# Patient Record
Sex: Male | Born: 1950
Health system: Southern US, Community
[De-identification: ages and names within clinical notes are randomized; demographics above are authoritative.]

## PROBLEM LIST (undated history)

## (undated) DIAGNOSIS — I499 Cardiac arrhythmia, unspecified: Secondary | ICD-10-CM

## (undated) HISTORY — PX: EYE SURGERY: SHX253

## (undated) HISTORY — PX: TONSILLECTOMY: SUR1361

## (undated) HISTORY — PX: HERNIA REPAIR: SHX51

## (undated) HISTORY — DX: Cardiac arrhythmia, unspecified: I49.9

---

## 2002-05-22 ENCOUNTER — Ambulatory Visit (HOSPITAL_COMMUNITY): Admission: RE | Admit: 2002-05-22 | Discharge: 2002-05-22 | Payer: Self-pay | Admitting: *Deleted

## 2002-05-22 ENCOUNTER — Encounter (INDEPENDENT_AMBULATORY_CARE_PROVIDER_SITE_OTHER): Payer: Self-pay | Admitting: *Deleted

## 2004-04-08 ENCOUNTER — Ambulatory Visit (HOSPITAL_COMMUNITY): Admission: RE | Admit: 2004-04-08 | Discharge: 2004-04-08 | Payer: Self-pay | Admitting: Internal Medicine

## 2004-06-30 ENCOUNTER — Ambulatory Visit: Payer: Self-pay | Admitting: Internal Medicine

## 2009-05-16 ENCOUNTER — Ambulatory Visit (HOSPITAL_COMMUNITY): Admission: RE | Admit: 2009-05-16 | Discharge: 2009-05-16 | Payer: Self-pay | Admitting: Internal Medicine

## 2015-10-14 DIAGNOSIS — H40053 Ocular hypertension, bilateral: Secondary | ICD-10-CM | POA: Diagnosis not present

## 2015-10-14 DIAGNOSIS — E291 Testicular hypofunction: Secondary | ICD-10-CM | POA: Diagnosis not present

## 2015-10-14 DIAGNOSIS — H524 Presbyopia: Secondary | ICD-10-CM | POA: Diagnosis not present

## 2015-11-28 DIAGNOSIS — N529 Male erectile dysfunction, unspecified: Secondary | ICD-10-CM | POA: Diagnosis not present

## 2015-12-24 DIAGNOSIS — N529 Male erectile dysfunction, unspecified: Secondary | ICD-10-CM | POA: Diagnosis not present

## 2016-01-21 DIAGNOSIS — N529 Male erectile dysfunction, unspecified: Secondary | ICD-10-CM | POA: Diagnosis not present

## 2016-02-17 DIAGNOSIS — N529 Male erectile dysfunction, unspecified: Secondary | ICD-10-CM | POA: Diagnosis not present

## 2016-04-01 DIAGNOSIS — N529 Male erectile dysfunction, unspecified: Secondary | ICD-10-CM | POA: Diagnosis not present

## 2016-04-29 DIAGNOSIS — N529 Male erectile dysfunction, unspecified: Secondary | ICD-10-CM | POA: Diagnosis not present

## 2016-05-26 DIAGNOSIS — N529 Male erectile dysfunction, unspecified: Secondary | ICD-10-CM | POA: Diagnosis not present

## 2016-07-09 DIAGNOSIS — N529 Male erectile dysfunction, unspecified: Secondary | ICD-10-CM | POA: Diagnosis not present

## 2016-08-11 DIAGNOSIS — N529 Male erectile dysfunction, unspecified: Secondary | ICD-10-CM | POA: Diagnosis not present

## 2016-08-24 DIAGNOSIS — E291 Testicular hypofunction: Secondary | ICD-10-CM | POA: Diagnosis not present

## 2016-08-24 DIAGNOSIS — Z125 Encounter for screening for malignant neoplasm of prostate: Secondary | ICD-10-CM | POA: Diagnosis not present

## 2016-08-24 DIAGNOSIS — E785 Hyperlipidemia, unspecified: Secondary | ICD-10-CM | POA: Diagnosis not present

## 2016-08-24 DIAGNOSIS — Z79899 Other long term (current) drug therapy: Secondary | ICD-10-CM | POA: Diagnosis not present

## 2016-08-31 DIAGNOSIS — E290 Testicular hyperfunction: Secondary | ICD-10-CM | POA: Diagnosis not present

## 2016-08-31 DIAGNOSIS — Z0001 Encounter for general adult medical examination with abnormal findings: Secondary | ICD-10-CM | POA: Diagnosis not present

## 2016-08-31 DIAGNOSIS — Z23 Encounter for immunization: Secondary | ICD-10-CM | POA: Diagnosis not present

## 2016-08-31 DIAGNOSIS — R7301 Impaired fasting glucose: Secondary | ICD-10-CM | POA: Diagnosis not present

## 2016-11-11 DIAGNOSIS — N529 Male erectile dysfunction, unspecified: Secondary | ICD-10-CM | POA: Diagnosis not present

## 2016-12-08 ENCOUNTER — Ambulatory Visit (INDEPENDENT_AMBULATORY_CARE_PROVIDER_SITE_OTHER): Payer: Medicare HMO | Admitting: Urology

## 2016-12-08 ENCOUNTER — Ambulatory Visit: Payer: Self-pay | Admitting: Urology

## 2016-12-08 DIAGNOSIS — N486 Induration penis plastica: Secondary | ICD-10-CM | POA: Diagnosis not present

## 2016-12-08 DIAGNOSIS — N401 Enlarged prostate with lower urinary tract symptoms: Secondary | ICD-10-CM

## 2016-12-08 DIAGNOSIS — K4090 Unilateral inguinal hernia, without obstruction or gangrene, not specified as recurrent: Secondary | ICD-10-CM

## 2016-12-08 DIAGNOSIS — E291 Testicular hypofunction: Secondary | ICD-10-CM

## 2016-12-08 DIAGNOSIS — N5201 Erectile dysfunction due to arterial insufficiency: Secondary | ICD-10-CM

## 2016-12-10 DIAGNOSIS — N529 Male erectile dysfunction, unspecified: Secondary | ICD-10-CM | POA: Diagnosis not present

## 2017-02-01 DIAGNOSIS — N529 Male erectile dysfunction, unspecified: Secondary | ICD-10-CM | POA: Diagnosis not present

## 2017-04-30 DIAGNOSIS — N529 Male erectile dysfunction, unspecified: Secondary | ICD-10-CM | POA: Diagnosis not present

## 2017-05-22 DIAGNOSIS — W312XXA Contact with powered woodworking and forming machines, initial encounter: Secondary | ICD-10-CM | POA: Diagnosis not present

## 2017-05-22 DIAGNOSIS — M79642 Pain in left hand: Secondary | ICD-10-CM | POA: Diagnosis not present

## 2017-05-22 DIAGNOSIS — S61217A Laceration without foreign body of left little finger without damage to nail, initial encounter: Secondary | ICD-10-CM | POA: Diagnosis not present

## 2017-05-22 DIAGNOSIS — W268XXA Contact with other sharp object(s), not elsewhere classified, initial encounter: Secondary | ICD-10-CM | POA: Diagnosis not present

## 2017-05-22 DIAGNOSIS — S61317A Laceration without foreign body of left little finger with damage to nail, initial encounter: Secondary | ICD-10-CM | POA: Diagnosis not present

## 2017-06-14 DIAGNOSIS — N529 Male erectile dysfunction, unspecified: Secondary | ICD-10-CM | POA: Diagnosis not present

## 2017-07-23 DIAGNOSIS — N529 Male erectile dysfunction, unspecified: Secondary | ICD-10-CM | POA: Diagnosis not present

## 2017-08-12 DIAGNOSIS — N529 Male erectile dysfunction, unspecified: Secondary | ICD-10-CM | POA: Diagnosis not present

## 2017-09-06 DIAGNOSIS — R945 Abnormal results of liver function studies: Secondary | ICD-10-CM | POA: Diagnosis not present

## 2017-09-06 DIAGNOSIS — R7301 Impaired fasting glucose: Secondary | ICD-10-CM | POA: Diagnosis not present

## 2017-09-06 DIAGNOSIS — Z125 Encounter for screening for malignant neoplasm of prostate: Secondary | ICD-10-CM | POA: Diagnosis not present

## 2017-09-06 DIAGNOSIS — Z79899 Other long term (current) drug therapy: Secondary | ICD-10-CM | POA: Diagnosis not present

## 2017-09-06 DIAGNOSIS — E785 Hyperlipidemia, unspecified: Secondary | ICD-10-CM | POA: Diagnosis not present

## 2017-09-06 DIAGNOSIS — N529 Male erectile dysfunction, unspecified: Secondary | ICD-10-CM | POA: Diagnosis not present

## 2017-09-13 DIAGNOSIS — Z23 Encounter for immunization: Secondary | ICD-10-CM | POA: Diagnosis not present

## 2017-09-13 DIAGNOSIS — Z6828 Body mass index (BMI) 28.0-28.9, adult: Secondary | ICD-10-CM | POA: Diagnosis not present

## 2017-09-13 DIAGNOSIS — Z0001 Encounter for general adult medical examination with abnormal findings: Secondary | ICD-10-CM | POA: Diagnosis not present

## 2017-09-13 DIAGNOSIS — E291 Testicular hypofunction: Secondary | ICD-10-CM | POA: Diagnosis not present

## 2017-09-13 DIAGNOSIS — I491 Atrial premature depolarization: Secondary | ICD-10-CM | POA: Diagnosis not present

## 2017-09-13 DIAGNOSIS — N183 Chronic kidney disease, stage 3 (moderate): Secondary | ICD-10-CM | POA: Diagnosis not present

## 2017-10-04 DIAGNOSIS — N529 Male erectile dysfunction, unspecified: Secondary | ICD-10-CM | POA: Diagnosis not present

## 2017-11-16 DIAGNOSIS — N529 Male erectile dysfunction, unspecified: Secondary | ICD-10-CM | POA: Diagnosis not present

## 2017-11-29 DIAGNOSIS — N529 Male erectile dysfunction, unspecified: Secondary | ICD-10-CM | POA: Diagnosis not present

## 2017-12-14 DIAGNOSIS — N529 Male erectile dysfunction, unspecified: Secondary | ICD-10-CM | POA: Diagnosis not present

## 2018-01-10 DIAGNOSIS — N529 Male erectile dysfunction, unspecified: Secondary | ICD-10-CM | POA: Diagnosis not present

## 2018-02-08 DIAGNOSIS — N529 Male erectile dysfunction, unspecified: Secondary | ICD-10-CM | POA: Diagnosis not present

## 2018-02-24 DIAGNOSIS — N529 Male erectile dysfunction, unspecified: Secondary | ICD-10-CM | POA: Diagnosis not present

## 2018-03-15 DIAGNOSIS — N529 Male erectile dysfunction, unspecified: Secondary | ICD-10-CM | POA: Diagnosis not present

## 2018-03-30 DIAGNOSIS — N529 Male erectile dysfunction, unspecified: Secondary | ICD-10-CM | POA: Diagnosis not present

## 2018-04-21 DIAGNOSIS — N529 Male erectile dysfunction, unspecified: Secondary | ICD-10-CM | POA: Diagnosis not present

## 2018-05-16 DIAGNOSIS — N529 Male erectile dysfunction, unspecified: Secondary | ICD-10-CM | POA: Diagnosis not present

## 2018-06-03 DIAGNOSIS — N529 Male erectile dysfunction, unspecified: Secondary | ICD-10-CM | POA: Diagnosis not present

## 2018-06-21 DIAGNOSIS — N529 Male erectile dysfunction, unspecified: Secondary | ICD-10-CM | POA: Diagnosis not present

## 2018-07-11 DIAGNOSIS — N529 Male erectile dysfunction, unspecified: Secondary | ICD-10-CM | POA: Diagnosis not present

## 2018-07-19 DIAGNOSIS — R69 Illness, unspecified: Secondary | ICD-10-CM | POA: Diagnosis not present

## 2018-08-25 DIAGNOSIS — L723 Sebaceous cyst: Secondary | ICD-10-CM | POA: Diagnosis not present

## 2018-08-25 DIAGNOSIS — N529 Male erectile dysfunction, unspecified: Secondary | ICD-10-CM | POA: Diagnosis not present

## 2018-08-31 DIAGNOSIS — L723 Sebaceous cyst: Secondary | ICD-10-CM | POA: Diagnosis not present

## 2018-09-01 DIAGNOSIS — R69 Illness, unspecified: Secondary | ICD-10-CM | POA: Diagnosis not present

## 2018-09-12 DIAGNOSIS — N529 Male erectile dysfunction, unspecified: Secondary | ICD-10-CM | POA: Diagnosis not present

## 2018-09-19 DIAGNOSIS — R69 Illness, unspecified: Secondary | ICD-10-CM | POA: Diagnosis not present

## 2018-09-27 DIAGNOSIS — Z125 Encounter for screening for malignant neoplasm of prostate: Secondary | ICD-10-CM | POA: Diagnosis not present

## 2018-09-27 DIAGNOSIS — Z79899 Other long term (current) drug therapy: Secondary | ICD-10-CM | POA: Diagnosis not present

## 2018-09-27 DIAGNOSIS — N183 Chronic kidney disease, stage 3 (moderate): Secondary | ICD-10-CM | POA: Diagnosis not present

## 2018-09-27 DIAGNOSIS — E785 Hyperlipidemia, unspecified: Secondary | ICD-10-CM | POA: Diagnosis not present

## 2018-09-27 DIAGNOSIS — N529 Male erectile dysfunction, unspecified: Secondary | ICD-10-CM | POA: Diagnosis not present

## 2018-09-27 DIAGNOSIS — N486 Induration penis plastica: Secondary | ICD-10-CM | POA: Diagnosis not present

## 2018-09-27 DIAGNOSIS — R7301 Impaired fasting glucose: Secondary | ICD-10-CM | POA: Diagnosis not present

## 2018-09-28 DIAGNOSIS — N529 Male erectile dysfunction, unspecified: Secondary | ICD-10-CM | POA: Diagnosis not present

## 2018-10-03 DIAGNOSIS — E291 Testicular hypofunction: Secondary | ICD-10-CM | POA: Diagnosis not present

## 2018-10-03 DIAGNOSIS — N183 Chronic kidney disease, stage 3 (moderate): Secondary | ICD-10-CM | POA: Diagnosis not present

## 2018-10-03 DIAGNOSIS — Z0001 Encounter for general adult medical examination with abnormal findings: Secondary | ICD-10-CM | POA: Diagnosis not present

## 2018-10-03 DIAGNOSIS — N529 Male erectile dysfunction, unspecified: Secondary | ICD-10-CM | POA: Diagnosis not present

## 2018-10-03 DIAGNOSIS — Z6829 Body mass index (BMI) 29.0-29.9, adult: Secondary | ICD-10-CM | POA: Diagnosis not present

## 2018-10-05 ENCOUNTER — Encounter (INDEPENDENT_AMBULATORY_CARE_PROVIDER_SITE_OTHER): Payer: Self-pay | Admitting: *Deleted

## 2018-11-14 DIAGNOSIS — N529 Male erectile dysfunction, unspecified: Secondary | ICD-10-CM | POA: Diagnosis not present

## 2018-11-21 DIAGNOSIS — R69 Illness, unspecified: Secondary | ICD-10-CM | POA: Diagnosis not present

## 2018-12-22 DIAGNOSIS — N529 Male erectile dysfunction, unspecified: Secondary | ICD-10-CM | POA: Diagnosis not present

## 2019-01-18 DIAGNOSIS — N529 Male erectile dysfunction, unspecified: Secondary | ICD-10-CM | POA: Diagnosis not present

## 2019-02-07 DIAGNOSIS — N529 Male erectile dysfunction, unspecified: Secondary | ICD-10-CM | POA: Diagnosis not present

## 2019-03-01 DIAGNOSIS — N529 Male erectile dysfunction, unspecified: Secondary | ICD-10-CM | POA: Diagnosis not present

## 2019-03-02 DIAGNOSIS — R69 Illness, unspecified: Secondary | ICD-10-CM | POA: Diagnosis not present

## 2019-03-30 DIAGNOSIS — N529 Male erectile dysfunction, unspecified: Secondary | ICD-10-CM | POA: Diagnosis not present

## 2019-04-28 DIAGNOSIS — N529 Male erectile dysfunction, unspecified: Secondary | ICD-10-CM | POA: Diagnosis not present

## 2019-05-17 DIAGNOSIS — N529 Male erectile dysfunction, unspecified: Secondary | ICD-10-CM | POA: Diagnosis not present

## 2019-07-03 DIAGNOSIS — E291 Testicular hypofunction: Secondary | ICD-10-CM | POA: Diagnosis not present

## 2019-07-18 ENCOUNTER — Ambulatory Visit: Payer: Medicare HMO | Attending: Internal Medicine

## 2019-07-18 ENCOUNTER — Other Ambulatory Visit: Payer: Self-pay

## 2019-07-18 DIAGNOSIS — Z20822 Contact with and (suspected) exposure to covid-19: Secondary | ICD-10-CM | POA: Diagnosis not present

## 2019-07-20 LAB — NOVEL CORONAVIRUS, NAA: SARS-CoV-2, NAA: DETECTED — AB

## 2019-07-31 DIAGNOSIS — E291 Testicular hypofunction: Secondary | ICD-10-CM | POA: Diagnosis not present

## 2019-10-30 DIAGNOSIS — N183 Chronic kidney disease, stage 3 unspecified: Secondary | ICD-10-CM | POA: Diagnosis not present

## 2019-10-30 DIAGNOSIS — Z125 Encounter for screening for malignant neoplasm of prostate: Secondary | ICD-10-CM | POA: Diagnosis not present

## 2019-10-30 DIAGNOSIS — E291 Testicular hypofunction: Secondary | ICD-10-CM | POA: Diagnosis not present

## 2019-10-30 DIAGNOSIS — N486 Induration penis plastica: Secondary | ICD-10-CM | POA: Diagnosis not present

## 2019-10-30 DIAGNOSIS — R7301 Impaired fasting glucose: Secondary | ICD-10-CM | POA: Diagnosis not present

## 2019-10-30 DIAGNOSIS — N529 Male erectile dysfunction, unspecified: Secondary | ICD-10-CM | POA: Diagnosis not present

## 2019-10-30 DIAGNOSIS — Z79899 Other long term (current) drug therapy: Secondary | ICD-10-CM | POA: Diagnosis not present

## 2019-10-30 DIAGNOSIS — E785 Hyperlipidemia, unspecified: Secondary | ICD-10-CM | POA: Diagnosis not present

## 2019-11-03 DIAGNOSIS — Z0001 Encounter for general adult medical examination with abnormal findings: Secondary | ICD-10-CM | POA: Diagnosis not present

## 2019-11-03 DIAGNOSIS — E291 Testicular hypofunction: Secondary | ICD-10-CM | POA: Diagnosis not present

## 2019-11-03 DIAGNOSIS — N529 Male erectile dysfunction, unspecified: Secondary | ICD-10-CM | POA: Diagnosis not present

## 2019-11-03 DIAGNOSIS — R7301 Impaired fasting glucose: Secondary | ICD-10-CM | POA: Diagnosis not present

## 2019-11-03 DIAGNOSIS — Z6829 Body mass index (BMI) 29.0-29.9, adult: Secondary | ICD-10-CM | POA: Diagnosis not present

## 2019-11-06 ENCOUNTER — Encounter (INDEPENDENT_AMBULATORY_CARE_PROVIDER_SITE_OTHER): Payer: Self-pay | Admitting: *Deleted

## 2019-12-12 DIAGNOSIS — E291 Testicular hypofunction: Secondary | ICD-10-CM | POA: Diagnosis not present

## 2020-01-29 DIAGNOSIS — E291 Testicular hypofunction: Secondary | ICD-10-CM | POA: Diagnosis not present

## 2020-03-05 DIAGNOSIS — R69 Illness, unspecified: Secondary | ICD-10-CM | POA: Diagnosis not present

## 2020-03-08 DIAGNOSIS — R69 Illness, unspecified: Secondary | ICD-10-CM | POA: Diagnosis not present

## 2020-03-20 DIAGNOSIS — E291 Testicular hypofunction: Secondary | ICD-10-CM | POA: Diagnosis not present

## 2020-06-04 DIAGNOSIS — E291 Testicular hypofunction: Secondary | ICD-10-CM | POA: Diagnosis not present

## 2020-06-26 DIAGNOSIS — E291 Testicular hypofunction: Secondary | ICD-10-CM | POA: Diagnosis not present

## 2020-11-11 ENCOUNTER — Encounter (INDEPENDENT_AMBULATORY_CARE_PROVIDER_SITE_OTHER): Payer: Self-pay | Admitting: *Deleted

## 2020-11-29 ENCOUNTER — Other Ambulatory Visit (INDEPENDENT_AMBULATORY_CARE_PROVIDER_SITE_OTHER): Payer: Self-pay

## 2020-11-29 DIAGNOSIS — Z1211 Encounter for screening for malignant neoplasm of colon: Secondary | ICD-10-CM

## 2020-12-13 ENCOUNTER — Encounter (INDEPENDENT_AMBULATORY_CARE_PROVIDER_SITE_OTHER): Payer: Self-pay | Admitting: *Deleted

## 2020-12-13 ENCOUNTER — Telehealth (INDEPENDENT_AMBULATORY_CARE_PROVIDER_SITE_OTHER): Payer: Self-pay | Admitting: *Deleted

## 2020-12-13 MED ORDER — PEG 3350-KCL-NA BICARB-NACL 420 G PO SOLR: 4000.0000 mL | Freq: Once | ORAL | 0 refills | Status: AC

## 2020-12-13 NOTE — Telephone Encounter (Signed)
Done

## 2020-12-13 NOTE — Telephone Encounter (Signed)
Patient needs trilyte 

## 2021-01-03 ENCOUNTER — Telehealth (INDEPENDENT_AMBULATORY_CARE_PROVIDER_SITE_OTHER): Payer: Self-pay | Admitting: *Deleted

## 2021-01-03 NOTE — Telephone Encounter (Signed)
Referring MD/PCP: fagan  Procedure: tcs  Reason/Indication:  screening  Has patient had this procedure before?  Yes, 2006  If so, when, by whom and where?    Is there a family history of colon cancer?  no  Who?  What age when diagnosed?    Is patient diabetic? If yes, Type 1 or Type 2   no      Does patient have prosthetic heart valve or mechanical valve?  no  Do you have a pacemaker/defibrillator?  no  Has patient ever had endocarditis/atrial fibrillation? no  Does patient use oxygen? no  Has patient had joint replacement within last 12 months?  no  Is patient constipated or do they take laxatives? no  Does patient have a history of alcohol/drug use?  no  Have you had a stroke/heart attack last 6 mths? no  Do you take medicine for weight loss?  no  For male patients,: do you still have your menstrual cycle? no  Is patient on blood thinner such as Coumadin, Plavix and/or Aspirin? no  Medications: testosterone shot once a month  Allergies: nkda  Medication Adjustment per Dr Rehman/Dr Jenetta Downer   Procedure date & time: 01/23/21

## 2021-01-16 ENCOUNTER — Telehealth (INDEPENDENT_AMBULATORY_CARE_PROVIDER_SITE_OTHER): Payer: Self-pay

## 2021-01-16 MED ORDER — PEG 3350-KCL-NA BICARB-NACL 420 G PO SOLR: 4000.0000 mL | ORAL | 0 refills | Status: DC

## 2021-01-16 NOTE — Telephone Encounter (Signed)
Corey Suarez, CMA  

## 2021-01-23 ENCOUNTER — Ambulatory Visit (HOSPITAL_COMMUNITY)
Admission: RE | Admit: 2021-01-23 | Discharge: 2021-01-23 | Disposition: A | Discharge status: Home / Self Care | Payer: Medicare Other | Attending: Internal Medicine | Admitting: Internal Medicine

## 2021-01-23 ENCOUNTER — Encounter (HOSPITAL_COMMUNITY)
Admission: RE | Disposition: A | Discharge status: Home / Self Care | Payer: Self-pay | Source: Home / Self Care | Attending: Internal Medicine

## 2021-01-23 ENCOUNTER — Encounter (HOSPITAL_COMMUNITY): Payer: Self-pay | Admitting: Internal Medicine

## 2021-01-23 ENCOUNTER — Other Ambulatory Visit: Payer: Self-pay

## 2021-01-23 DIAGNOSIS — Z79899 Other long term (current) drug therapy: Secondary | ICD-10-CM | POA: Diagnosis not present

## 2021-01-23 DIAGNOSIS — D120 Benign neoplasm of cecum: Secondary | ICD-10-CM | POA: Diagnosis not present

## 2021-01-23 DIAGNOSIS — Z1211 Encounter for screening for malignant neoplasm of colon: Secondary | ICD-10-CM

## 2021-01-23 DIAGNOSIS — K5730 Diverticulosis of large intestine without perforation or abscess without bleeding: Secondary | ICD-10-CM | POA: Diagnosis not present

## 2021-01-23 HISTORY — PX: COLONOSCOPY: SHX5424

## 2021-01-23 HISTORY — PX: POLYPECTOMY: SHX5525

## 2021-01-23 LAB — HM COLONOSCOPY

## 2021-01-23 SURGERY — COLONOSCOPY
Anesthesia: Moderate Sedation

## 2021-01-23 MED ORDER — MEPERIDINE HCL 50 MG/ML IJ SOLN
INTRAMUSCULAR | Status: AC
  Filled 2021-01-23: qty 1

## 2021-01-23 MED ORDER — MIDAZOLAM HCL 5 MG/5ML IJ SOLN
INTRAMUSCULAR | Status: AC
  Filled 2021-01-23: qty 10

## 2021-01-23 MED ORDER — STERILE WATER FOR IRRIGATION IR SOLN
Status: DC | PRN
  Administered 2021-01-23: 1.5 mL

## 2021-01-23 MED ORDER — MIDAZOLAM HCL 5 MG/5ML IJ SOLN
INTRAMUSCULAR | Status: DC | PRN
  Administered 2021-01-23 (×3): 2 mg via INTRAVENOUS

## 2021-01-23 MED ORDER — SODIUM CHLORIDE 0.9 % IV SOLN: INTRAVENOUS | Status: DC

## 2021-01-23 MED ORDER — MEPERIDINE HCL 50 MG/ML IJ SOLN
INTRAMUSCULAR | Status: DC | PRN
  Administered 2021-01-23 (×2): 25 mg via INTRAVENOUS

## 2021-01-23 NOTE — H&P (Signed)
Corey Suarez is an 70 y.o. male.   Chief Complaint: Patient is here for colonoscopy. HPI: Patient is 70 year old Caucasian male who is here for screening colonoscopy.  Last exam was normal in 2006.  He denies abdominal pain change in bowel habits or rectal bleeding.  Family history is negative for CRC.  He does not take aspirin or anticoagulants.  Past medical history  Elevated intraocular pressure Testosterone deficiency  Past Surgical History:  Procedure Laterality Date   EYE SURGERY     HERNIA REPAIR     TONSILLECTOMY      History reviewed. No pertinent family history. Social History:  reports that he has never smoked. He has never used smokeless tobacco. He reports current alcohol use. He reports that he does not use drugs.  Allergies: No Known Allergies  Medications Prior to Admission  Medication Sig Dispense Refill   Cholecalciferol (VITAMIN D3) 125 MCG (5000 UT) TABS Take 5,000 Units by mouth daily.     LUMIGAN 0.01 % SOLN Place 1 drop into both eyes at bedtime.     neomycin-polymyxin b-dexamethasone (MAXITROL) 3.5-10000-0.1 OINT Place 1 application into the left eye 2 (two) times daily.     testosterone cypionate (DEPOTESTOSTERONE CYPIONATE) 200 MG/ML injection Inject 200 mg into the muscle every 28 (twenty-eight) days.     vitamin C (ASCORBIC ACID) 500 MG tablet Take 500 mg by mouth daily.     vitamin E 180 MG (400 UNITS) capsule Take 400 Units by mouth daily.     zinc gluconate 50 MG tablet Take 50 mg by mouth daily.     polyethylene glycol-electrolytes (TRILYTE) 420 g solution Take 4,000 mLs by mouth as directed. 4000 mL 0    No results found for this or any previous visit (from the past 48 hour(s)). No results found.  Review of Systems  Blood pressure (!) 150/104, pulse 78, temperature 97.8 F (36.6 C), temperature source Oral, resp. rate 15, height 5\' 11"  (1.803 m), weight 93 kg, SpO2 95 %. Physical Exam HENT:     Mouth/Throat:     Mouth: Mucous  membranes are moist.     Pharynx: Oropharynx is clear.  Eyes:     General: No scleral icterus.    Conjunctiva/sclera: Conjunctivae normal.  Cardiovascular:     Rate and Rhythm: Normal rate and regular rhythm.     Heart sounds: Normal heart sounds. No murmur heard. Pulmonary:     Effort: Pulmonary effort is normal.     Breath sounds: Normal breath sounds.  Abdominal:     General: There is no distension.     Palpations: Abdomen is soft.     Tenderness: There is no abdominal tenderness.  Musculoskeletal:        General: No swelling.     Cervical back: Neck supple.  Lymphadenopathy:     Cervical: No cervical adenopathy.  Skin:    General: Skin is warm and dry.  Neurological:     Mental Status: He is alert.     Assessment/Plan  Average risk screening colonoscopy  Hildred Laser, MD 01/23/2021, 9:49 AM

## 2021-01-23 NOTE — Discharge Instructions (Signed)
No aspirin or NSAIDs for 24 hours.   °Resume usual medications as before. °High-fiber diet. °No driving for 24 hours. °Physician will call with biopsy results. °

## 2021-01-23 NOTE — Op Note (Signed)
[facility] Patient Name: Corey Suarez Procedure Date: 01/23/2021 9:30 AM MRN: [id number] Date of Birth: 16-Jun-1951 Attending MD: Hildred Laser , MD CSN: [id number] Age: 70 Admit Type: Outpatient Procedure:                Colonoscopy Indications:              Screening for colorectal malignant neoplasm Providers:                Hildred Laser, MD, Caprice Kluver, Raphael Gibney,                            Technician Referring MD:             Asencion Noble, MD Medicines:                Meperidine 50 mg IV, Midazolam 6 mg IV Complications:            No immediate complications. Estimated Blood Loss:     Estimated blood loss was minimal. Procedure:                Pre-Anesthesia Assessment:                           - Prior to the procedure, a History and Physical                            was performed, and patient medications and                            allergies were reviewed. The patient's tolerance of                            previous anesthesia was also reviewed. The risks                            and benefits of the procedure and the sedation                            options and risks were discussed with the patient.                            All questions were answered, and informed consent                            was obtained. Prior Anticoagulants: The patient has                            taken no previous anticoagulant or antiplatelet                            agents. ASA Grade Assessment: I - A normal, healthy                            patient. After reviewing the risks and benefits,  the patient was deemed in satisfactory condition to                            undergo the procedure.                           After obtaining informed consent, the colonoscope                            was passed under direct vision. Throughout the                            procedure, the patient's blood pressure, pulse, and                             oxygen saturations were monitored continuously. The                            [id number]-[id number] ([id number]) scope was introduced through                            the anus and advanced to the the cecum, identified                            by appendiceal orifice and ileocecal valve. The                            colonoscopy was performed without difficulty. The                            patient tolerated the procedure well. The quality                            of the bowel preparation was excellent. The                            ileocecal valve, appendiceal orifice, and rectum                            were photographed. Scope In: 10:00:12 AM Scope Out: 10:15:34 AM Scope Withdrawal Time: 0 hours 9 minutes 16 seconds  Total Procedure Duration: 0 hours 15 minutes 22 seconds  Findings:      The perianal and digital rectal examinations were normal.      A diminutive polyp was found in the cecum. Biopsies were taken with a       cold forceps for histology.      Multiple small and large-mouthed diverticula were found in the sigmoid       colon.      The retroflexed view of the distal rectum and anal verge was normal and       showed no anal or rectal abnormalities. Impression:               - One diminutive polyp in the cecum. Biopsied.                           -  Diverticulosis in the sigmoid colon. Moderate Sedation:      Moderate (conscious) sedation was administered by the endoscopy nurse       and supervised by the endoscopist. The following parameters were       monitored: oxygen saturation, heart rate, blood pressure, CO2       capnography and response to care. Total physician intraservice time was       21 minutes. Recommendation:           - Patient has a contact number available for                            emergencies. The signs and symptoms of potential                            delayed complications were discussed with the                            patient. Return to  normal activities tomorrow.                            Written discharge instructions were provided to the                            patient.                           - High fiber diet today.                           - Continue present medications.                           - No aspirin, ibuprofen, naproxen, or other                            non-steroidal anti-inflammatory drugs for 1 day.                           - Await pathology results.                           - Repeat colonoscopy is recommended. The                            colonoscopy date will be determined after pathology                            results from today's exam become available for                            review. Procedure Code(s):        --- Professional ---                           [phone], Colonoscopy, flexible; with biopsy, single  or multiple                           G0500, Moderate sedation services provided by the                            same physician or other qualified health care                            professional performing a gastrointestinal                            endoscopic service that sedation supports,                            requiring the presence of an independent trained                            observer to assist in the monitoring of the                            patient's level of consciousness and physiological                            status; initial 15 minutes of intra-service time;                            patient age 35 years or older (additional time may                            be reported with [phone], as appropriate) Diagnosis Code(s):        --- Professional ---                           Z12.11, Encounter for screening for malignant                            neoplasm of colon                           K63.5, Polyp of colon                           K57.30, Diverticulosis of large intestine without                             perforation or abscess without bleeding CPT copyright 2019 [employer]. All rights reserved. The codes documented in this report are preliminary and upon coder review may  be revised to meet current compliance requirements. Hildred Laser, MD Hildred Laser, MD 01/23/2021 10:25:56 AM This report has been signed electronically. Number of Addenda: 0

## 2021-01-24 LAB — SURGICAL PATHOLOGY

## 2021-01-30 ENCOUNTER — Encounter (INDEPENDENT_AMBULATORY_CARE_PROVIDER_SITE_OTHER): Payer: Self-pay | Admitting: *Deleted

## 2021-01-31 ENCOUNTER — Encounter (HOSPITAL_COMMUNITY): Payer: Self-pay | Admitting: Internal Medicine

## 2021-02-10 DIAGNOSIS — H40013 Open angle with borderline findings, low risk, bilateral: Secondary | ICD-10-CM | POA: Diagnosis not present

## 2021-02-17 DIAGNOSIS — H402231 Chronic angle-closure glaucoma, bilateral, mild stage: Secondary | ICD-10-CM | POA: Diagnosis not present

## 2021-05-19 DIAGNOSIS — H401131 Primary open-angle glaucoma, bilateral, mild stage: Secondary | ICD-10-CM | POA: Diagnosis not present

## 2021-06-16 ENCOUNTER — Encounter (HOSPITAL_COMMUNITY): Payer: Self-pay

## 2021-06-16 ENCOUNTER — Other Ambulatory Visit: Payer: Self-pay

## 2021-06-16 ENCOUNTER — Emergency Department (HOSPITAL_COMMUNITY)
Admission: EM | Admit: 2021-06-16 | Discharge: 2021-06-16 | Disposition: A | Discharge status: Home / Self Care | Payer: Medicare Other | Attending: Emergency Medicine | Admitting: Emergency Medicine

## 2021-06-16 ENCOUNTER — Emergency Department (HOSPITAL_COMMUNITY): Payer: Medicare Other

## 2021-06-16 DIAGNOSIS — I480 Paroxysmal atrial fibrillation: Secondary | ICD-10-CM | POA: Diagnosis not present

## 2021-06-16 DIAGNOSIS — I4891 Unspecified atrial fibrillation: Secondary | ICD-10-CM | POA: Diagnosis present

## 2021-06-16 DIAGNOSIS — Z7982 Long term (current) use of aspirin: Secondary | ICD-10-CM | POA: Diagnosis not present

## 2021-06-16 DIAGNOSIS — R079 Chest pain, unspecified: Secondary | ICD-10-CM | POA: Diagnosis not present

## 2021-06-16 LAB — CBC WITH DIFFERENTIAL/PLATELET
Abs Immature Granulocytes: 0.04 10*3/uL (ref 0.00–0.07)
Basophils Absolute: 0 10*3/uL (ref 0.0–0.1)
Basophils Relative: 1 %
Eosinophils Absolute: 0.1 10*3/uL (ref 0.0–0.5)
Eosinophils Relative: 1 %
HCT: 51.7 % (ref 39.0–52.0)
Hemoglobin: 16.8 g/dL (ref 13.0–17.0)
Immature Granulocytes: 1 %
Lymphocytes Relative: 24 %
Lymphs Abs: 1.5 10*3/uL (ref 0.7–4.0)
MCH: 28.7 pg (ref 26.0–34.0)
MCHC: 32.5 g/dL (ref 30.0–36.0)
MCV: 88.2 fL (ref 80.0–100.0)
Monocytes Absolute: 0.5 10*3/uL (ref 0.1–1.0)
Monocytes Relative: 8 %
Neutro Abs: 4 10*3/uL (ref 1.7–7.7)
Neutrophils Relative %: 65 %
Platelets: 274 10*3/uL (ref 150–400)
RBC: 5.86 MIL/uL — ABNORMAL HIGH (ref 4.22–5.81)
RDW: 14.1 % (ref 11.5–15.5)
WBC: 6.1 10*3/uL (ref 4.0–10.5)
nRBC: 0 % (ref 0.0–0.2)

## 2021-06-16 LAB — COMPREHENSIVE METABOLIC PANEL
ALT: 36 U/L (ref 0–44)
AST: 35 U/L (ref 15–41)
Albumin: 4.3 g/dL (ref 3.5–5.0)
Alkaline Phosphatase: 34 U/L — ABNORMAL LOW (ref 38–126)
Anion gap: 4 — ABNORMAL LOW (ref 5–15)
BUN: 21 mg/dL (ref 8–23)
CO2: 23 mmol/L (ref 22–32)
Calcium: 8.8 mg/dL — ABNORMAL LOW (ref 8.9–10.3)
Chloride: 109 mmol/L (ref 98–111)
Creatinine, Ser: 1.14 mg/dL (ref 0.61–1.24)
GFR, Estimated: >60 mL/min (ref >60)
Glucose, Bld: 101 mg/dL — ABNORMAL HIGH (ref 70–99)
Potassium: 4.3 mmol/L (ref 3.5–5.1)
Sodium: 136 mmol/L (ref 135–145)
Total Bilirubin: 1.7 mg/dL — ABNORMAL HIGH (ref 0.3–1.2)
Total Protein: 7 g/dL (ref 6.5–8.1)

## 2021-06-16 LAB — TROPONIN I (HIGH SENSITIVITY)
Troponin I (High Sensitivity): 8 ng/L (ref <18)
Troponin I (High Sensitivity): 17 ng/L (ref <18)

## 2021-06-16 LAB — D-DIMER, QUANTITATIVE: D-Dimer, Quant: <0.27 ug/mL-FEU (ref 0.00–0.50)

## 2021-06-16 MED ORDER — ASPIRIN 81 MG PO CHEW: 81.0000 mg | CHEWABLE_TABLET | Freq: Every day | ORAL | 0 refills | Status: DC

## 2021-06-16 NOTE — ED Triage Notes (Signed)
Patient went to pass a physical for a job and states he had an elevated HR and was told to come to the ED for Atrial fib. HR in triage-107. Patient denies any chest pain

## 2021-06-16 NOTE — ED Provider Notes (Signed)
[place] DEPT Provider Note   CSN: [id number] Arrival date & time: 06/16/21  1016     History Chief Complaint  Patient presents with   Atrial Fibrillation    Corey Suarez is a 70 y.o. male.  HPI    70 year old male comes in with chief complaint of atrial fibrillation.  Patient has no significant medical history but does take testosterone monthly for the last decade.  He currently works in [employer] and had 1000 mile trip over a course of 17 hours recently.  He reports that he was doing his work physical and they found that he was having A. fib, and was advised to come to the ER.  Patient denies any recent chest pain or shortness of breath.  He has mild shortness of breath with exertion, but always thought it is related to his age.  He has not had any palpitations or chest pain recently. No history of PE, DVT.  Patient currently is taking over-the-counter vitamins only.  History reviewed. No pertinent past medical history.  There are no problems to display for this patient.   Past Surgical History:  Procedure Laterality Date   COLONOSCOPY N/A 01/23/2021   Procedure: COLONOSCOPY;  Surgeon: Rogene Houston, MD;  Location: [facility];  Service: Endoscopy;  Laterality: N/A;  930   EYE SURGERY     HERNIA REPAIR     POLYPECTOMY  01/23/2021   Procedure: POLYPECTOMY;  Surgeon: Rogene Houston, MD;  Location: [facility] SUITE;  Service: Endoscopy;;   TONSILLECTOMY         History reviewed. No pertinent family history.  Social History   Tobacco Use   Smoking status: Never   Smokeless tobacco: Never  Vaping Use   Vaping Use: Never used  Substance Use Topics   Alcohol use: Yes    Comment: occasionally   Drug use: Never    Home Medications Prior to Admission medications   Medication Sig Start Date End Date Taking? Authorizing Provider  aspirin 81 MG chewable tablet Chew 1 tablet (81 mg total) by mouth daily.  06/16/21  Yes Varney Biles, MD  Cholecalciferol (VITAMIN D3) 125 MCG (5000 UT) TABS Take 5,000 Units by mouth daily.    [provider]  LUMIGAN 0.01 % SOLN Place 1 drop into both eyes at bedtime. 01/09/21   [provider]  neomycin-polymyxin b-dexamethasone (MAXITROL) 3.5-10000-0.1 OINT Place 1 application into the left eye 2 (two) times daily. 01/09/21   [provider]  testosterone cypionate (DEPOTESTOSTERONE CYPIONATE) 200 MG/ML injection Inject 200 mg into the muscle every 28 (twenty-eight) days. 07/22/20   [provider]  vitamin C (ASCORBIC ACID) 500 MG tablet Take 500 mg by mouth daily.    [provider]  vitamin E 180 MG (400 UNITS) capsule Take 400 Units by mouth daily.    [provider]  zinc gluconate 50 MG tablet Take 50 mg by mouth daily.    [provider]    Allergies    Patient has no known allergies.  Review of Systems   Review of Systems  Constitutional:  Positive for activity change.  Respiratory:  Negative for shortness of breath.   Cardiovascular:  Negative for chest pain.  Gastrointestinal:  Negative for nausea and vomiting.  Hematological:  Does not bruise/bleed easily.   Physical Exam Updated Vital Signs BP (!) 136/93   Pulse 64   Temp 97.8 F (36.6 C) (Oral)   Resp 11  Ht 5\' 11"  (1.803 m)   Wt 96.2 kg   SpO2 97%   BMI 29.57 kg/m   Physical Exam Vitals and nursing note reviewed.  Constitutional:      Appearance: He is well-developed.  HENT:     Head: Atraumatic.  Cardiovascular:     Rate and Rhythm: Normal rate.  Pulmonary:     Effort: Pulmonary effort is normal.  Musculoskeletal:        General: No tenderness.     Cervical back: Neck supple.     Right lower leg: No edema.     Left lower leg: No edema.  Skin:    General: Skin is warm.  Neurological:     Mental Status: He is alert and oriented to person, place, and time.    ED Results / Procedures / Treatments    Labs (all labs ordered are listed, but only abnormal results are displayed) Labs Reviewed  COMPREHENSIVE METABOLIC PANEL - Abnormal; Notable for the following components:      Result Value   Glucose, Bld 101 (*)    Calcium 8.8 (*)    Alkaline Phosphatase 34 (*)    Total Bilirubin 1.7 (*)    Anion gap 4 (*)    All other components within normal limits  CBC WITH DIFFERENTIAL/PLATELET - Abnormal; Notable for the following components:   RBC 5.86 (*)    All other components within normal limits  D-DIMER, QUANTITATIVE  TROPONIN I (HIGH SENSITIVITY)  TROPONIN I (HIGH SENSITIVITY)    EKG EKG Interpretation  Date/Time:  Monday June 16 2021 10:30:46 EST Ventricular Rate:  104 PR Interval:  163 QRS Duration: 89 QT Interval:  338 QTC Calculation: 445 R Axis:   -27 Text Interpretation: Sinus tachycardia Borderline left axis deviation No acute changes No old tracing to compare Confirmed by Varney Biles [phone]) on 06/16/2021 6:35:13 PM     Radiology DG Chest 2 View  Result Date: 06/16/2021 CLINICAL DATA:  Chest pain EXAM: CHEST - 2 VIEW COMPARISON:  05/16/2009 FINDINGS: Cardiac size is within normal limits. Thoracic aorta is tortuous and ectatic. Increase in AP diameter of chest suggests COPD. There are no signs of pulmonary edema or focal pulmonary consolidation. There is no pleural effusion or pneumothorax. IMPRESSION: There are no signs of pulmonary edema or new focal infiltrates. Electronically Signed   By: Elmer Picker M.D.   On: 06/16/2021 10:58    Procedures Procedures   Medications Ordered in ED Medications - No data to display  ED Course  I have reviewed the triage vital signs and the nursing notes.  Pertinent labs & imaging results that were available during my care of the patient were reviewed by me and considered in my medical decision making (see chart for details).    MDM Rules/Calculators/A&P     CHA2DS2-VASc Score: 1                      70 year old male comes in with chief complaint of A. fib.  Patient's [place] Vasc score is 1. He is currently not in A. Fib -he is in normal sinus rhythm and during my evaluation his heart rate was 70  We will start him on aspirin.  We will advised that he follows up with A. fib clinic for optimal management of his A. Fib  Patient is on testosterone and also reports long distance travels for his work, D-dimer was ordered and it is negative.  Strict ER return precautions have been  discussed, and patient is agreeing with the plan and is comfortable with the workup done and the recommendations from the ER.    Final Clinical Impression(s) / ED Diagnoses Final diagnoses:  Paroxysmal atrial fibrillation [facility])    Rx / DC Orders ED Discharge Orders          Ordered    aspirin 81 MG chewable tablet  Daily        06/16/21 2100             Varney Biles, MD 06/16/21 2118

## 2021-06-16 NOTE — Discharge Instructions (Addendum)
You are seen in the ER after you were noted to be in a heart rhythm called atrial fibrillation.  Currently you are back in normal sinus rhythm.  Given that there is risk for stroke, we are advising that he start taking aspirin daily.  Your work-up in the ER is reassuring.  We recommend that you follow-up with the cardiology A. fib clinic for further management.  If you start having chest pains, shortness of breath, dizziness and severe palpitations return to the ER immediately.

## 2021-06-16 NOTE — ED Provider Notes (Signed)
Emergency Medicine Provider Triage Evaluation Note  Corey Suarez , a 70 y.o. male  was evaluated in triage.  Pt complains of A fib with RVR. He went to get a physical done today and they did an EKG and noticed he was in A fib with RVR and told him to come to the ED immediately. HR 156 on EKG at clinic; currently 101. He denies any specific chest pain or SOB. No history of A fib. Has been feeling fine otherwise.   Review of Systems  Positive: No symptoms Negative: - chest pain, SOB  Physical Exam  BP (!) 147/106 (BP Location: Left Arm)   Pulse (!) 101   Temp 97.8 F (36.6 C) (Oral)   Resp 19   Ht 5\' 11"  (1.803 m)   Wt 96.2 kg   SpO2 95%   BMI 29.57 kg/m  Gen:   Awake, no distress   Resp:  Normal effort  MSK:   Moves extremities without difficulty  Other:  Irregularly irregular  Medical Decision Making  Medically screening exam initiated at 10:39 AM.  Appropriate orders placed.  Corey Suarez was informed that the remainder of the evaluation will be completed by another provider, this initial triage assessment does not replace that evaluation, and the importance of remaining in the ED until their evaluation is complete.     Eustaquio Maize, PA-C 06/16/21 1041    Dorie Rank, MD 06/18/21 [phone]

## 2021-06-24 ENCOUNTER — Ambulatory Visit (HOSPITAL_COMMUNITY)
Admission: RE | Admit: 2021-06-24 | Discharge: 2021-06-24 | Disposition: A | Discharge status: Home / Self Care | Payer: Medicare Other | Source: Ambulatory Visit | Attending: Physician Assistant | Admitting: Physician Assistant

## 2021-06-24 ENCOUNTER — Encounter (HOSPITAL_COMMUNITY): Payer: Self-pay | Admitting: Physician Assistant

## 2021-06-24 ENCOUNTER — Other Ambulatory Visit: Payer: Self-pay

## 2021-06-24 VITALS — BP 142/88 | HR 56 | Ht 71.0 in | Wt 213.0 lb

## 2021-06-24 DIAGNOSIS — I480 Paroxysmal atrial fibrillation: Secondary | ICD-10-CM | POA: Diagnosis not present

## 2021-06-24 NOTE — Progress Notes (Signed)
Primary Care Physician: Asencion Noble, MD Primary Cardiologist: none Primary Electrophysiologist: none Referring Physician: [facility]   Corey Suarez is a 70 y.o. male with a history of atrial fibrillation who presents for consultation in the [facility].  The patient was initially diagnosed with atrial fibrillation 06/16/21 after presenting for a work physical. He was found to have rapid afib at that time (ECG in media tab shows afib HR 156). He had converted by the time he presented to the ED. Patient has a CHADS2VASC score of 1. He was asymptomatic. He denies any significant alcohol use or snoring.   Today, he denies symptoms of palpitations, chest pain, shortness of breath, orthopnea, PND, lower extremity edema, dizziness, presyncope, syncope, snoring, daytime somnolence, bleeding, or neurologic sequela. The patient is tolerating medications without difficulties and is otherwise without complaint today.    Atrial Fibrillation Risk Factors:  he does not have symptoms or diagnosis of sleep apnea. he does not have a history of rheumatic fever. he does not have a history of alcohol use. The patient does not have a history of early familial atrial fibrillation or other arrhythmias.  he has a BMI of Body mass index is 29.71 kg/m.Marland Kitchen Filed Weights   06/24/21 0919  Weight: 96.6 kg    No family history on file.   Atrial Fibrillation Management history:  Previous antiarrhythmic drugs: none Previous cardioversions: none Previous ablations: none CHADS2VASC score: 1 Anticoagulation history: none   No past medical history on file. Past Surgical History:  Procedure Laterality Date   COLONOSCOPY N/A 01/23/2021   Procedure: COLONOSCOPY;  Surgeon: Rogene Houston, MD;  [facility];  Service: Endoscopy;  Laterality: N/A;  930   EYE SURGERY     HERNIA REPAIR     POLYPECTOMY  01/23/2021   Procedure: POLYPECTOMY;  Surgeon: Rogene Houston,  MD;  [facility]: [facility];  Service: Endoscopy;;   TONSILLECTOMY      Current Outpatient Medications  Medication Sig Dispense Refill   ALPHAGAN P 0.1 % SOLN Place 1 drop into the left eye 2 (two) times daily.     aspirin 81 MG chewable tablet Chew 1 tablet (81 mg total) by mouth daily. 30 tablet 0   Cholecalciferol (VITAMIN D3) 125 MCG (5000 UT) TABS Take 5,000 Units by mouth daily.     LUMIGAN 0.01 % SOLN Place 1 drop into both eyes at bedtime.     Multiple Vitamins-Minerals (MULTIVITAMIN MEN 50+ PO) Take by mouth.     neomycin-polymyxin b-dexamethasone (MAXITROL) 3.5-10000-0.1 OINT Place 1 application into the left eye 2 (two) times daily.     testosterone cypionate (DEPOTESTOSTERONE CYPIONATE) 200 MG/ML injection Inject 200 mg into the muscle every 28 (twenty-eight) days.     vitamin C (ASCORBIC ACID) 500 MG tablet Take 500 mg by mouth daily.     vitamin E 180 MG (400 UNITS) capsule Take 400 Units by mouth daily.     zinc gluconate 50 MG tablet Take 50 mg by mouth daily.     No current facility-administered medications for this encounter.    No Known Allergies  Social History   Socioeconomic History   Marital status: Married    Spouse name: Not on file   Number of children: Not on file   Years of education: Not on file   Highest education level: Not on file  Occupational History   Not on file  Tobacco Use   Smoking status: Never  Smokeless tobacco: Never  Vaping Use   Vaping Use: Never used  Substance and Sexual Activity   Alcohol use: Yes    Alcohol/week: 1.0 standard drink    Types: 1 Standard drinks or equivalent per week    Comment: 1 mixed drink every 36mths 06/24/21   Drug use: Never   Sexual activity: Not on file  Other Topics Concern   Not on file  Social History Narrative   Not on file   Social Determinants of Health   Financial Resource Strain: Not on file  Food Insecurity: Not on file  Transportation Needs: Not on file  Physical Activity: Not on  file  Stress: Not on file  Social Connections: Not on file  Intimate Partner Violence: Not on file     ROS- All systems are reviewed and negative except as per the HPI above.  Physical Exam: Vitals:   06/24/21 0919  BP: (!) 142/88  Pulse: (!) 56  Weight: 96.6 kg  Height: 5\' 11"  (1.803 m)    GEN- The patient is a well appearing male, alert and oriented x 3 today.   Head- normocephalic, atraumatic Eyes-  Sclera clear, conjunctiva pink Ears- hearing intact Oropharynx- clear Neck- supple  Lungs- Clear to ausculation bilaterally, normal work of breathing Heart- Regular rate and rhythm, no murmurs, rubs or gallops  GI- soft, NT, ND, + BS Extremities- no clubbing, cyanosis, or edema MS- no significant deformity or atrophy Skin- no rash or lesion Psych- euthymic mood, full affect Neuro- strength and sensation are intact  Wt Readings from Last 3 Encounters:  06/24/21 96.6 kg  06/16/21 96.2 kg  01/23/21 93 kg    EKG today demonstrates  SB Vent. rate 56 BPM PR interval 176 ms QRS duration 88 ms QT/QTcB 416/401 ms   Epic records are reviewed at length today  CHA2DS2-VASc Score = 1  The patient's score is based upon: CHF History: 0 HTN History: 0 Diabetes History: 0 Stroke History: 0 Vascular Disease History: 0 Age Score: 1 Gender Score: 0      ASSESSMENT AND PLAN: 1. Paroxysmal Atrial Fibrillation (ICD10:  I48.0) The patient's CHA2DS2-VASc score is 1, indicating a 0.6% annual risk of stroke.   General education about afib provided and questions answered. We also discussed his stroke risk and the risks and benefits of anticoagulation. Anticoagulation not indicated at this time with low CV score. Will stop ASA.  Check echocardiogram Will not start AV nodal agent right now given baseline bradycardia.  We discussed smart device technology for home monitoring.     Follow up in the AF clinic in 4-6 weeks.    [facility] [address] [place], [place] [address] [phone] 06/24/2021 10:13 AM

## 2021-06-26 ENCOUNTER — Encounter (HOSPITAL_COMMUNITY): Payer: Self-pay | Admitting: *Deleted

## 2021-08-04 ENCOUNTER — Ambulatory Visit (HOSPITAL_COMMUNITY)
Admission: RE | Admit: 2021-08-04 | Discharge: 2021-08-04 | Disposition: A | Discharge status: Home / Self Care | Payer: Medicare Other | Source: Ambulatory Visit | Attending: Physician Assistant | Admitting: Physician Assistant

## 2021-08-04 ENCOUNTER — Other Ambulatory Visit: Payer: Self-pay

## 2021-08-04 DIAGNOSIS — I77819 Aortic ectasia, unspecified site: Secondary | ICD-10-CM | POA: Insufficient documentation

## 2021-08-04 DIAGNOSIS — I480 Paroxysmal atrial fibrillation: Secondary | ICD-10-CM | POA: Diagnosis not present

## 2021-08-04 DIAGNOSIS — I517 Cardiomegaly: Secondary | ICD-10-CM | POA: Diagnosis not present

## 2021-08-04 LAB — ECHOCARDIOGRAM COMPLETE
Area-P 1/2: 2.41 cm2
S' Lateral: 2.4 cm

## 2021-08-07 ENCOUNTER — Ambulatory Visit (HOSPITAL_COMMUNITY)
Admission: RE | Admit: 2021-08-07 | Discharge: 2021-08-07 | Disposition: A | Discharge status: Home / Self Care | Payer: Medicare Other | Source: Ambulatory Visit | Attending: Physician Assistant | Admitting: Physician Assistant

## 2021-08-07 ENCOUNTER — Other Ambulatory Visit: Payer: Self-pay

## 2021-08-07 ENCOUNTER — Encounter (HOSPITAL_COMMUNITY): Payer: Self-pay | Admitting: Physician Assistant

## 2021-08-07 VITALS — BP 134/92 | HR 67 | Ht 71.0 in | Wt 211.0 lb

## 2021-08-07 DIAGNOSIS — I77819 Aortic ectasia, unspecified site: Secondary | ICD-10-CM | POA: Insufficient documentation

## 2021-08-07 DIAGNOSIS — I480 Paroxysmal atrial fibrillation: Secondary | ICD-10-CM | POA: Insufficient documentation

## 2021-08-07 MED ORDER — DILTIAZEM HCL 30 MG PO TABS: ORAL_TABLET | ORAL | 1 refills | Status: DC

## 2021-08-07 NOTE — Progress Notes (Signed)
Primary Care Physician: Asencion Noble, MD Primary Cardiologist: none Primary Electrophysiologist: none Referring Physician: [facility]   Corey Suarez is a 71 y.o. male with a history of atrial fibrillation who presents for follow up in the [facility].  The patient was initially diagnosed with atrial fibrillation 06/16/21 after presenting for a work physical. He was found to have rapid afib at that time (ECG in media tab shows afib HR 156). He had converted by the time he presented to the ED. Patient has a CHADS2VASC score of 1. He was asymptomatic. He denies any significant alcohol use or snoring.   On follow up today, patient reports that he had an episode of afib during the holidays. He had just received news that a close friend had passed away. He also had more alcohol than usual during the holidays. His episode lasted ~12 hours. He was not symptomatic but his smart watch alerted him. He had an echo which showed normal EF, mild dilatation of aorta.   Today, he denies symptoms of palpitations, chest pain, shortness of breath, orthopnea, PND, lower extremity edema, dizziness, presyncope, syncope, snoring, daytime somnolence, bleeding, or neurologic sequela. The patient is tolerating medications without difficulties and is otherwise without complaint today.    Atrial Fibrillation Risk Factors:  he does not have symptoms or diagnosis of sleep apnea. he does not have a history of rheumatic fever. he does not have a history of alcohol use. The patient does not have a history of early familial atrial fibrillation or other arrhythmias.  he has a BMI of Body mass index is 29.43 kg/m.Marland Kitchen Filed Weights   08/07/21 0925  Weight: 95.7 kg     No family history on file.   Atrial Fibrillation Management history:  Previous antiarrhythmic drugs: none Previous cardioversions: none Previous ablations: none CHADS2VASC score: 1 Anticoagulation history:  none   No past medical history on file. Past Surgical History:  Procedure Laterality Date   COLONOSCOPY N/A 01/23/2021   Procedure: COLONOSCOPY;  Surgeon: Rogene Houston, MD;  [facility];  Service: Endoscopy;  Laterality: N/A;  930   EYE SURGERY     HERNIA REPAIR     POLYPECTOMY  01/23/2021   Procedure: POLYPECTOMY;  Surgeon: Rogene Houston, MD;  [facility];  Service: Endoscopy;;   TONSILLECTOMY      Current Outpatient Medications  Medication Sig Dispense Refill   ALPHAGAN P 0.1 % SOLN Place 1 drop into the left eye 2 (two) times daily.     aspirin 81 MG chewable tablet Chew 1 tablet (81 mg total) by mouth daily. 30 tablet 0   Cholecalciferol (VITAMIN D3) 125 MCG (5000 UT) TABS Take 5,000 Units by mouth daily.     diltiazem (CARDIZEM) 30 MG tablet Take 1 tablet every 4 hours AS NEEDED for heart rate >100 as long as blood pressure >100. 30 tablet 1   LUMIGAN 0.01 % SOLN Place 1 drop into both eyes at bedtime.     Multiple Vitamins-Minerals (MULTIVITAMIN MEN 50+ PO) Take by mouth.     neomycin-polymyxin b-dexamethasone (MAXITROL) 3.5-10000-0.1 OINT Place 1 application into the left eye 2 (two) times daily.     testosterone cypionate (DEPOTESTOSTERONE CYPIONATE) 200 MG/ML injection Inject 200 mg into the muscle every 28 (twenty-eight) days.     vitamin C (ASCORBIC ACID) 500 MG tablet Take 500 mg by mouth daily.     vitamin E 180 MG (400 UNITS) capsule Take  400 Units by mouth daily.     zinc gluconate 50 MG tablet Take 50 mg by mouth daily.     No current facility-administered medications for this encounter.    No Known Allergies  Social History   Socioeconomic History   Marital status: Married    Spouse name: Not on file   Number of children: Not on file   Years of education: Not on file   Highest education level: Not on file  Occupational History   Not on file  Tobacco Use   Smoking status: Never   Smokeless tobacco: Never  Vaping Use   Vaping  Use: Never used  Substance and Sexual Activity   Alcohol use: Yes    Alcohol/week: 1.0 standard drink    Types: 1 Standard drinks or equivalent per week    Comment: 1 mixed drink every 22mths 06/24/21   Drug use: Never   Sexual activity: Not on file  Other Topics Concern   Not on file  Social History Narrative   Not on file   Social Determinants of Health   Financial Resource Strain: Not on file  Food Insecurity: Not on file  Transportation Needs: Not on file  Physical Activity: Not on file  Stress: Not on file  Social Connections: Not on file  Intimate Partner Violence: Not on file     ROS- All systems are reviewed and negative except as per the HPI above.  Physical Exam: Vitals:   08/07/21 0925  BP: (!) 134/92  Pulse: 67  Weight: 95.7 kg  Height: 5\' 11"  (1.803 m)    GEN- The patient is a well appearing male, alert and oriented x 3 today.   HEENT-head normocephalic, atraumatic, sclera clear, conjunctiva pink, hearing intact, trachea midline. Lungs- Clear to ausculation bilaterally, normal work of breathing Heart- Regular rate and rhythm, no murmurs, rubs or gallops  GI- soft, NT, ND, + BS Extremities- no clubbing, cyanosis, or edema MS- no significant deformity or atrophy Skin- no rash or lesion Psych- euthymic mood, full affect Neuro- strength and sensation are intact   Wt Readings from Last 3 Encounters:  08/07/21 95.7 kg  06/24/21 96.6 kg  06/16/21 96.2 kg    EKG today demonstrates  SR Vent. rate 67 BPM PR interval 172 ms QRS duration 84 ms QT/QTcB 364/384 ms  Echo 08/04/21 demonstrates 1. Left ventricular ejection fraction by 3D volume is 57 %. The left  ventricle has normal function. The left ventricle has no regional wall  motion abnormalities. There is mild left ventricular hypertrophy. Left  ventricular diastolic parameters are consistent with Grade I diastolic dysfunction (impaired relaxation). The average left ventricular global longitudinal  strain is -18.8 %. The global longitudinal strain is normal.   2. Right ventricular systolic function is normal. The right ventricular  size is normal.   3. The mitral valve is normal in structure. No evidence of mitral valve  regurgitation. No evidence of mitral stenosis.   4. The aortic valve is normal in structure. Aortic valve regurgitation is  not visualized. No aortic stenosis is present.   5. Aortic dilatation noted. There is mild dilatation of the aortic root,  measuring 43 mm. There is mild dilatation of the ascending aorta,  measuring 42 mm.   6. The inferior vena cava is normal in size with greater than 50%  respiratory variability, suggesting right atrial pressure of 3 mmHg.    Epic records are reviewed at length today  CHA2DS2-VASc Score = 1  The  patient's score is based upon: CHF History: 0 HTN History: 0 Diabetes History: 0 Stroke History: 0 Vascular Disease History: 0 Age Score: 1 Gender Score: 0       ASSESSMENT AND PLAN: 1. Paroxysmal Atrial Fibrillation (ICD10:  I48.0) The patient's CHA2DS2-VASc score is 1, indicating a 0.6% annual risk of stroke.   Suspect last episode related to passing of friend/alcohol.  Anticoagulation not indicated at this time with low CV score.  Start diltiazem 30 mg PRN q 4 hours for heart racing.  Apple Watch for home monitoring. If he has quick return of afib, will consider daily rate control/AAD/ablation.   2. Aortic dilatation  Aortic root 43 mm  Ascending aortia 42 mm   Follow up in the AF clinic in 3 months. Will also have him establish care with primary cardiologist, he lives in [place].    [facility] [address] [place], [place] [address] [phone] 08/07/2021 10:38 AM

## 2021-08-07 NOTE — Patient Instructions (Signed)
Cardizem 30mg -- take 1 tablet every 4 hours AS NEEDED for heart rate >100 as long as top number of blood pressure >100.  

## 2021-09-15 DIAGNOSIS — H401132 Primary open-angle glaucoma, bilateral, moderate stage: Secondary | ICD-10-CM | POA: Diagnosis not present

## 2021-09-22 DIAGNOSIS — H2513 Age-related nuclear cataract, bilateral: Secondary | ICD-10-CM | POA: Diagnosis not present

## 2021-09-22 DIAGNOSIS — H401133 Primary open-angle glaucoma, bilateral, severe stage: Secondary | ICD-10-CM | POA: Diagnosis not present

## 2021-09-29 DIAGNOSIS — H2513 Age-related nuclear cataract, bilateral: Secondary | ICD-10-CM | POA: Diagnosis not present

## 2021-09-29 DIAGNOSIS — H401132 Primary open-angle glaucoma, bilateral, moderate stage: Secondary | ICD-10-CM | POA: Diagnosis not present

## 2021-09-29 DIAGNOSIS — H401133 Primary open-angle glaucoma, bilateral, severe stage: Secondary | ICD-10-CM | POA: Diagnosis not present

## 2021-10-06 NOTE — Progress Notes (Signed)
?Cardiology Office Note:   ?Date:  10/08/2021  ?NAME:  Corey Suarez    ?MRN: [id number] ?DOB:  1950/08/28  ? ?PCP:  Asencion Noble, MD  ?Cardiologist:  None  ?Electrophysiologist:  None  ? ?Referring MD: Oliver Barre, PA  ? ?Chief Complaint  ?Patient presents with  ? Follow-up  ? ?History of Present Illness:   ?Corey Suarez is a 71 y.o. male with a hx of paroxysmal atrial fibrillation who is being seen today for the evaluation of paroxysmal atrial fibrillation at the request of Asencion Noble, MD. diagnosed with atrial fibrillation in December 2022 after pursuing a work physical.  Found to be in rapid A-fib that resolved without medication.  He has been maintaining sinus rhythm since that time.  He reports 1 other episode of A-fib.  He is monitoring this on his Apple watch.  Reports receiving bad news about a friend dying.  This resulted in an A-fib episode for several minutes.  His EKG demonstrates sinus rhythm.  He has been maintaining sinus rhythm.  He reports he works part-time at the [employer].  He has no limitations such as chest pain or trouble breathing.  He is a retired Administrator.  There is no history of diabetes, hypertension, heart attack or stroke.  He really has no medical problems.  CHA2DS2-VASc score equals 1.  He is on aspirin but this is not necessary.  Overall without major complaints and low A-fib burden per his report.  He has a never smoker.  Rare alcohol.  No drug use is reported.  He is a retired Administrator.  Currently working part-time in the [place] area.  No concerns for sleep apnea.  No structured exercise but no limitations. ? ?Problem List ?Paroxysmal Afib ?-06/16/2021 ?2. Ascending aorta dilation  ?-43 mm 07/2021 ? ?Past Medical History: ?History reviewed. No pertinent past medical history. ? ?Past Surgical History: ?Past Surgical History:  ?Procedure Laterality Date  ? COLONOSCOPY N/A 01/23/2021  ? Procedure: COLONOSCOPY;  Surgeon: Rogene Houston, MD;  [facility];  Service: Endoscopy;  Laterality: N/A;  930  ? EYE SURGERY    ? HERNIA REPAIR    ? POLYPECTOMY  01/23/2021  ? Procedure: POLYPECTOMY;  Surgeon: Rogene Houston, MD;  [facility];  Service: Endoscopy;;  ? TONSILLECTOMY    ? ? ?Current Medications: ?Current Meds  ?Medication Sig  ? ALPHAGAN P 0.1 % SOLN Place 1 drop into the left eye 2 (two) times daily.  ? Cholecalciferol (VITAMIN D3) 125 MCG (5000 UT) TABS Take 5,000 Units by mouth daily.  ? diltiazem (CARDIZEM) 30 MG tablet Take 1 tablet every 4 hours AS NEEDED for heart rate >100 as long as blood pressure >100.  ? LUMIGAN 0.01 % SOLN Place 1 drop into both eyes at bedtime.  ? Multiple Vitamins-Minerals (MULTIVITAMIN MEN 50+ PO) Take by mouth.  ? testosterone cypionate (DEPOTESTOSTERONE CYPIONATE) 200 MG/ML injection Inject 200 mg into the muscle every 28 (twenty-eight) days.  ? vitamin C (ASCORBIC ACID) 500 MG tablet Take 500 mg by mouth daily.  ? vitamin E 180 MG (400 UNITS) capsule Take 400 Units by mouth daily.  ? zinc gluconate 50 MG tablet Take 50 mg by mouth daily.  ? [DISCONTINUED] aspirin 81 MG chewable tablet Chew 1 tablet (81 mg total) by mouth daily.  ?  ? ?Allergies:    ?Patient has no known allergies.  ? ?Social History: ?Social History  ? ?Socioeconomic History  ?  Marital status: Married  ?  Spouse name: 3  ? Number of children: Not on file  ? Years of education: Not on file  ? Highest education level: Not on file  ?Occupational History  ? Occupation: Administrator  ?Tobacco Use  ? Smoking status: Never  ? Smokeless tobacco: Never  ?Vaping Use  ? Vaping Use: Never used  ?Substance and Sexual Activity  ? Alcohol use: Not Currently  ?  Alcohol/week: 1.0 standard drink  ?  Types: 1 Standard drinks or equivalent per week  ?  Comment: 1 mixed drink every 72mhs 06/24/21  ? Drug use: Never  ? Sexual activity: Not on file  ?Other Topics Concern  ? Not on file  ?Social History Narrative  ? Not on file  ? ?Social Determinants of Health   ? ?Financial Resource Strain: Not on file  ?Food Insecurity: Not on file  ?Transportation Needs: Not on file  ?Physical Activity: Not on file  ?Stress: Not on file  ?Social Connections: Not on file  ?  ? ?Family History: ?The patient's family history is not on file. ? ?ROS:   ?All other ROS reviewed and negative. Pertinent positives noted in the HPI.    ? ?EKGs/Labs/Other Studies Reviewed:   ?The following studies were personally reviewed by me today: ? ?EKG:  EKG is ordered today.  The ekg ordered today demonstrates normal sinus rhythm heart rate 65, no acute ischemic changes or evidence of infarction, and was personally reviewed by me.  ? ?TTE 08/04/2021 1. Left ventricular ejection fraction by 3D volume is 57 %. The left  ?ventricle has normal function. The left ventricle has no regional wall  ?motion abnormalities. There is mild left ventricular hypertrophy. Left  ?ventricular diastolic parameters are  ?consistent with Grade I diastolic dysfunction (impaired relaxation). The  ?average left ventricular global longitudinal strain is -18.8 %. The global  ?longitudinal strain is normal.  ? 2. Right ventricular systolic function is normal. The right ventricular  ?size is normal.  ? 3. The mitral valve is normal in structure. No evidence of mitral valve  ?regurgitation. No evidence of mitral stenosis.  ? 4. The aortic valve is normal in structure. Aortic valve regurgitation is  ?not visualized. No aortic stenosis is present.  ? 5. Aortic dilatation noted. There is mild dilatation of the aortic root,  ?measuring 43 mm. There is mild dilatation of the ascending aorta,  ?measuring 42 mm.  ? 6. The inferior vena cava is normal in size with greater than 50%  ?respiratory variability, suggesting right atrial pressure of 3 mmHg.  ? ?Recent Labs: ?06/16/2021: ALT 36; BUN 21; Creatinine, Ser 1.14; Hemoglobin 16.8; Platelets 274; Potassium 4.3; Sodium 136  ? ?Recent Lipid Panel ?No results found for: CHOL, TRIG, HDL, CHOLHDL,  VLDL, LDLCALC, LDLDIRECT ? ?Physical Exam:   ?VS:  BP 138/80   Pulse 70   Ht '5\' 11"'$  (1.803 m)   Wt 207 lb (93.9 kg)   SpO2 98%   BMI 28.87 kg/m?    ?Wt Readings from Last 3 Encounters:  ?10/08/21 207 lb (93.9 kg)  ?08/07/21 211 lb (95.7 kg)  ?06/24/21 213 lb (96.6 kg)  ?  ?General: Well nourished, well developed, in no acute distress ?Head: Atraumatic, normal size  ?Eyes: PEERLA, EOMI  ?Neck: Supple, no JVD ?Endocrine: No thryomegaly ?Cardiac: Normal S1, S2; RRR; no murmurs, rubs, or gallops ?Lungs: Clear to auscultation bilaterally, no wheezing, rhonchi or rales  ?Abd: Soft, nontender, no hepatomegaly  ?Ext: No  edema, pulses 2+ ?Musculoskeletal: No deformities, BUE and BLE strength normal and equal ?Skin: Warm and dry, no rashes   ?Neuro: Alert and oriented to person, place, time, and situation, CNII-XII grossly intact, no focal deficits  ?Psych: Normal mood and affect  ? ?ASSESSMENT:   ?MOSTYN VARNELL is a 71 y.o. male who presents for the following: ?1. Paroxysmal atrial fibrillation ([place])   ?2. Dilation of aorta ([facility])   ? ? ?PLAN:   ?1. Paroxysmal atrial fibrillation ([place]) ?-Paroxysmal atrial fibrillation.  Only 2 episodes in the past 4 months.  Seems to be triggered by stress.  Chads Vascor equals 1.  No need for anticoagulation.  Okay to stop aspirin.  No benefit for aspirin therapy in this setting. ?-I think it is okay to continue with surveillance.  He reports no limitations or symptoms.  Echo shows normal LV function without significant valvular heart disease.  We did discuss ablation versus rhythm control medications.  For now given no recurrence he would like to hold on this.  I believe he would be a great candidate for ablation if his A-fib recurs. ?-He will continue with surveillance via [place].  He has diltiazem if his heart rate is elevated. ?-He will see me back in 6 months to discuss further. ?-No family history of heart disease.  No limitations.  No symptoms of CAD.  I see no need  for a stress test at this time. ? ?2. Dilation of aorta ([facility]) ?-Aorta is likely upper limits of normal for his age.  We will keep a close eye on this.  Repeat echo in 1 year. ? ? ?  ? ?Disposition: Return in

## 2021-10-08 ENCOUNTER — Other Ambulatory Visit: Payer: Self-pay

## 2021-10-08 ENCOUNTER — Ambulatory Visit: Payer: Medicare Other | Admitting: Cardiovascular Disease

## 2021-10-08 ENCOUNTER — Encounter: Payer: Self-pay | Admitting: Cardiovascular Disease

## 2021-10-08 VITALS — BP 138/80 | HR 70 | Ht 71.0 in | Wt 207.0 lb

## 2021-10-08 DIAGNOSIS — I480 Paroxysmal atrial fibrillation: Secondary | ICD-10-CM

## 2021-10-08 DIAGNOSIS — I77819 Aortic ectasia, unspecified site: Secondary | ICD-10-CM

## 2021-10-08 NOTE — Patient Instructions (Signed)
Medication Instructions: ?STOP Aspirin ? ? ?Labwork: ?None today ? ?Testing/Procedures: ?None today ? ?Follow-Up: ?6 months ? ?Any Other Special Instructions Will Be Listed Below (If Applicable). ? ?If you need a refill on your cardiac medications before your next appointment, please call your pharmacy. ? ?

## 2021-10-20 DIAGNOSIS — H401113 Primary open-angle glaucoma, right eye, severe stage: Secondary | ICD-10-CM | POA: Diagnosis not present

## 2021-10-20 DIAGNOSIS — H401133 Primary open-angle glaucoma, bilateral, severe stage: Secondary | ICD-10-CM | POA: Diagnosis not present

## 2021-11-04 ENCOUNTER — Ambulatory Visit (HOSPITAL_COMMUNITY): Payer: Medicare Other | Admitting: Physician Assistant

## 2021-11-04 DIAGNOSIS — N1831 Chronic kidney disease, stage 3a: Secondary | ICD-10-CM | POA: Diagnosis not present

## 2021-11-04 DIAGNOSIS — R7301 Impaired fasting glucose: Secondary | ICD-10-CM | POA: Diagnosis not present

## 2021-11-04 DIAGNOSIS — Z79899 Other long term (current) drug therapy: Secondary | ICD-10-CM | POA: Diagnosis not present

## 2021-11-04 DIAGNOSIS — E785 Hyperlipidemia, unspecified: Secondary | ICD-10-CM | POA: Diagnosis not present

## 2021-11-07 DIAGNOSIS — N1831 Chronic kidney disease, stage 3a: Secondary | ICD-10-CM | POA: Diagnosis not present

## 2021-11-07 DIAGNOSIS — I480 Paroxysmal atrial fibrillation: Secondary | ICD-10-CM | POA: Diagnosis not present

## 2021-11-24 DIAGNOSIS — H401133 Primary open-angle glaucoma, bilateral, severe stage: Secondary | ICD-10-CM | POA: Diagnosis not present

## 2022-01-30 ENCOUNTER — Other Ambulatory Visit: Payer: Self-pay

## 2022-01-30 ENCOUNTER — Telehealth: Payer: Self-pay | Admitting: Cardiovascular Disease

## 2022-01-30 DIAGNOSIS — I480 Paroxysmal atrial fibrillation: Secondary | ICD-10-CM

## 2022-01-30 NOTE — Telephone Encounter (Signed)
Called patient- scheduled for 08/01 at 4:20 PM.  Patient verbalized understanding.   He was given address, and instructions on how to get into the office.  Patient understood.

## 2022-01-30 NOTE — Telephone Encounter (Signed)
Corey Suarez from Dr. Asencion Noble office states that Dr. Willey Blade ran an EKG and pt is in afib and wants to speak to Dr. Audie Box asap. She states that the office closes at 11 today and they are holding the pt there until they can speak with Dr. Audie Box.   Best call back number [phone]

## 2022-01-30 NOTE — Telephone Encounter (Signed)
I placed referral to EP, will call patient with appointment when Dr.O'Neal is back in office.   Thanks!

## 2022-02-02 DIAGNOSIS — H401133 Primary open-angle glaucoma, bilateral, severe stage: Secondary | ICD-10-CM | POA: Diagnosis not present

## 2022-02-05 ENCOUNTER — Emergency Department (HOSPITAL_COMMUNITY)
Admission: EM | Admit: 2022-02-05 | Discharge: 2022-02-05 | Disposition: A | Discharge status: Home / Self Care | Payer: Medicare Other | Attending: Emergency Medicine | Admitting: Emergency Medicine

## 2022-02-05 ENCOUNTER — Emergency Department (HOSPITAL_COMMUNITY): Payer: Medicare Other

## 2022-02-05 ENCOUNTER — Encounter (HOSPITAL_COMMUNITY): Payer: Self-pay | Admitting: Emergency Medicine

## 2022-02-05 DIAGNOSIS — I480 Paroxysmal atrial fibrillation: Secondary | ICD-10-CM | POA: Diagnosis not present

## 2022-02-05 DIAGNOSIS — J439 Emphysema, unspecified: Secondary | ICD-10-CM | POA: Diagnosis not present

## 2022-02-05 DIAGNOSIS — I4891 Unspecified atrial fibrillation: Secondary | ICD-10-CM | POA: Diagnosis not present

## 2022-02-05 DIAGNOSIS — R000 Tachycardia, unspecified: Secondary | ICD-10-CM | POA: Diagnosis not present

## 2022-02-05 DIAGNOSIS — R42 Dizziness and giddiness: Secondary | ICD-10-CM | POA: Diagnosis present

## 2022-02-05 LAB — CBC
HCT: 56.8 % — ABNORMAL HIGH (ref 39.0–52.0)
Hemoglobin: 18.6 g/dL — ABNORMAL HIGH (ref 13.0–17.0)
MCH: 28.9 pg (ref 26.0–34.0)
MCHC: 32.7 g/dL (ref 30.0–36.0)
MCV: 88.3 fL (ref 80.0–100.0)
Platelets: 287 10*3/uL (ref 150–400)
RBC: 6.43 MIL/uL — ABNORMAL HIGH (ref 4.22–5.81)
RDW: 14.8 % (ref 11.5–15.5)
WBC: 10 10*3/uL (ref 4.0–10.5)
nRBC: 0 % (ref 0.0–0.2)

## 2022-02-05 LAB — BASIC METABOLIC PANEL
Anion gap: 7 (ref 5–15)
BUN: 21 mg/dL (ref 8–23)
CO2: 24 mmol/L (ref 22–32)
Calcium: 8.9 mg/dL (ref 8.9–10.3)
Chloride: 107 mmol/L (ref 98–111)
Creatinine, Ser: 1.22 mg/dL (ref 0.61–1.24)
GFR, Estimated: >60 mL/min (ref >60)
Glucose, Bld: 121 mg/dL — ABNORMAL HIGH (ref 70–99)
Potassium: 4 mmol/L (ref 3.5–5.1)
Sodium: 138 mmol/L (ref 135–145)

## 2022-02-05 MED ORDER — METOPROLOL TARTRATE 5 MG/5ML IV SOLN
2.5000 mg | Freq: Once | INTRAVENOUS | Status: AC
  Administered 2022-02-05: 2.5 mg via INTRAVENOUS
  Filled 2022-02-05: qty 5

## 2022-02-05 MED ORDER — ONDANSETRON HCL 4 MG/2ML IJ SOLN
4.0000 mg | Freq: Once | INTRAMUSCULAR | Status: AC
  Administered 2022-02-05: 4 mg via INTRAVENOUS
  Filled 2022-02-05: qty 2

## 2022-02-05 MED ORDER — PROPOFOL 10 MG/ML IV BOLUS
0.5000 mg/kg | Freq: Once | INTRAVENOUS | Status: AC
  Administered 2022-02-05: 45.6 mg via INTRAVENOUS
  Filled 2022-02-05: qty 20

## 2022-02-05 MED ORDER — METOPROLOL TARTRATE 5 MG/5ML IV SOLN
5.0000 mg | Freq: Once | INTRAVENOUS | Status: AC
  Administered 2022-02-05: 5 mg via INTRAVENOUS
  Filled 2022-02-05: qty 5

## 2022-02-05 MED ORDER — SODIUM CHLORIDE 0.9 % IV BOLUS
1000.0000 mL | Freq: Once | INTRAVENOUS | Status: AC
  Administered 2022-02-05: 1000 mL via INTRAVENOUS

## 2022-02-05 NOTE — ED Triage Notes (Signed)
Pt c/o increased HR this morning. Pt states he took 1 diltiazem '30mg'$  this morning when he woke up and realized his HR was 170.

## 2022-02-05 NOTE — ED Provider Notes (Signed)
71 yo male here with parox A Fib, low CHADS Vasc score, not on a/c, onset A Fib this morning.  850 pm -patient reassessed and remains in A-fib with RVR, blood pressure stable.  He continues to have the same symptoms of feeling like he "cannot catch my breath".  We discussed this point the options of cardioversion in the ED, given his symptom onset within 24 hours, versus hospitalization on IV infusion for rate control.  Both he and his wife would prefer the cardioversion.  We discussed the risks and benefits of conscious sedation cardioversion, including apnea, hypoxia, allergic reaction, hypotension, respiratory failure, need for intubation, failed cardioversion, chest pain, chest burns, arrhythmia and death.  They verbalized understanding and agreement with the plan.  He has been confirmed n.p.o. since last night.  .Sedation  Date/Time: 02/05/2022 10:25 AM  Performed by: Wyvonnia Dusky, MD Authorized by: Wyvonnia Dusky, MD   Consent:    Consent obtained:  Verbal and written   Consent given by:  Patient and spouse   Risks discussed:  Allergic reaction, dysrhythmia, inadequate sedation, nausea, vomiting, respiratory compromise necessitating ventilatory assistance and intubation, prolonged sedation necessitating reversal and prolonged hypoxia resulting in organ damage   Alternatives discussed:  Analgesia without sedation Universal protocol:    Procedure explained and questions answered to patient or proxy's satisfaction: yes     Relevant documents present and verified: yes     Test results available: yes     Imaging studies available: yes     Required blood products, implants, devices, and special equipment available: yes     Site/side marked: yes     Immediately prior to procedure, a time out was called: yes     Patient identity confirmed:  Arm band Indications:    Procedure performed:  Cardioversion   Procedure necessitating sedation performed by:  Physician performing  sedation Pre-sedation assessment:    Time since last food or drink:  > 8 hours   ASA classification: class 1 - normal, healthy patient     Mallampati score:  II - soft palate, uvula, fauces visible   Neck mobility: normal     Pre-sedation assessments completed and reviewed: airway patency, cardiovascular function, hydration status, mental status, nausea/vomiting, pain level, respiratory function and temperature   Immediate pre-procedure details:    Reassessment: Patient reassessed immediately prior to procedure     Reviewed: vital signs, relevant labs/tests and NPO status     Verified: bag valve mask available, emergency equipment available, intubation equipment available, IV patency confirmed, oxygen available, reversal medications available and suction available   Procedure details (see MAR for exact dosages):    Preoxygenation:  Nasal cannula   Sedation:  Propofol   Intended level of sedation: deep   Analgesia:  None   Intra-procedure monitoring:  Blood pressure monitoring, cardiac monitor, continuous capnometry, continuous pulse oximetry, frequent LOC assessments and frequent vital sign checks   Intra-procedure events: none     Total Provider sedation time (minutes):  25 Post-procedure details:    Attendance: Constant attendance by certified staff until patient recovered     Recovery: Patient returned to pre-procedure baseline     Post-sedation assessments completed and reviewed: airway patency, cardiovascular function, hydration status, mental status, nausea/vomiting, pain level, respiratory function and temperature     Patient is stable for discharge or admission: yes     Procedure completion:  Tolerated well, no immediate complications .Cardioversion  Date/Time: 02/05/2022 10:26 AM  Performed by: Wyvonnia Dusky, MD  Authorized by: Wyvonnia Dusky, MD   Consent:    Consent obtained:  Written and verbal   Consent given by:  Patient and parent   Risks discussed:  Cutaneous  burn, death, induced arrhythmia and pain   Alternatives discussed:  Rate-control medication, delayed treatment and observation Pre-procedure details:    Cardioversion basis:  Elective   Rhythm:  Atrial fibrillation   Electrode placement:  Anterior-posterior Patient sedated: Yes. Refer to sedation procedure documentation for details of sedation.  Attempt one:    Cardioversion mode:  Synchronous   Waveform:  Biphasic   Shock (Joules):  200   Shock outcome:  Conversion to normal sinus rhythm Post-procedure details:    Patient status:  Awake   Patient tolerance of procedure:  Tolerated well, no immediate complications .Critical Care  Performed by: Wyvonnia Dusky, MD Authorized by: Wyvonnia Dusky, MD   Critical care provider statement:    Critical care time (minutes):  30   Critical care time was exclusive of:  Separately billable procedures and treating other patients   Critical care was necessary to treat or prevent imminent or life-threatening deterioration of the following conditions:  Circulatory failure   Critical care was time spent personally by me on the following activities:  Ordering and performing treatments and interventions, ordering and review of laboratory studies, ordering and review of radiographic studies, pulse oximetry, review of old charts, examination of patient and evaluation of patient's response to treatment Comments:     IV rate control for A Fib, cardioversion, rhythm and telemetry monitoring     Clinical Course as of 02/05/22 1207  Thu Feb 05, 2022  0600 Patient with known history of paroxysmal atrial fibrillation.  I have reviewed his cardiology notes.  Due to his scoring, he is not a candidate for anticoagulation.  He does have his schedule follow-up with his cardiologist soon as well as electrophysiology as he may need an ablation.  He reports he typically will convert with medications. [DW]  7412 Heart rate is improving, but still in atrial  fibrillation.  Patient is receiving IV fluids.  Patient is hesitant to have cardioversion.  He would like to wait another hour or 2 as he has self converted previously [DW]  0710 Signed out to Dr. Langston Masker.  If patient self converts he can be discharged home [DW]  0732 HR 110 bpm on my assessment now, remains in A Fib, just received 2nd round IV metoprolol.  BP 878 systolic [MT]  6767 Patient was successfully cardioverted to NSR [MT]  1201 Patient remains in a normal sinus rhythm completely asymptomatic.  Wife will be driving him home.  They have a cardiology appointment on the first of next month, 5 days from now, which I think is reasonable follow-up.  They report to me now that the patient was outside working in the heat all day, which may have been a trigger for his A-fib.  I advised staying cool, hydrated, and avoiding overexertion, telemetry follow-up with cardiology. [MT]    Clinical Course User Index [DW] Ripley Fraise, MD [MT] Langston Masker Carola Rhine, MD      Wyvonnia Dusky, MD 02/05/22 [phone]

## 2022-02-05 NOTE — Discharge Instructions (Addendum)
Your A-fib may have been triggered by overexertion, sweating, dehydration in the heat.  I would recommend that you stay cool and hydrated the next several days, and that you follow-up with a cardiologist next week as scheduled.  You were given sedation medicine for cardioversion in the ER today.  You should not drive or operate heavy machinery or swim for the remainder of the day.

## 2022-02-05 NOTE — ED Notes (Signed)
Electrical cardioversion complete at this time with delivery of one 200J shock. Rhythm converted from A-fib to NSR with rate of 65.

## 2022-02-05 NOTE — ED Provider Notes (Signed)
[facility] EMERGENCY DEPARTMENT Provider Note   CSN: [id number] Arrival date & time: 02/05/22  0440     History  Chief Complaint  Patient presents with   Atrial Fibrillation    Corey Suarez is a 71 y.o. male.  The history is provided by the patient.  Atrial Fibrillation This is a recurrent problem. Pertinent negatives include no chest pain and no shortness of breath. Nothing relieves the symptoms.    Patient with history of paroxysmal atrial fibrillation. He presents with an episode that began tonight.  He reports he woke up in the middle the night and noted that his heart was elevated.  He felt that he was back in atrial fibrillation and he took a dose of diltiazem 30 mg. He did not see any improvement in his heart rate so he decided to be evaluated.  He does report working outside in the heat which may have contributed to his symptoms.  No chest pain or shortness of breath.  No syncope.  He reports mild lightheadedness.  He is not on any anticoagulation.  Home Medications Prior to Admission medications   Medication Sig Start Date End Date Taking? Authorizing Provider  ALPHAGAN P 0.1 % SOLN Place 1 drop into the left eye 2 (two) times daily. 06/19/21   [provider]  Cholecalciferol (VITAMIN D3) 125 MCG (5000 UT) TABS Take 5,000 Units by mouth daily.    [provider]  diltiazem (CARDIZEM) 30 MG tablet Take 1 tablet every 4 hours AS NEEDED for heart rate >100 as long as blood pressure >100. 08/07/21   Fenton, Clint R, PA  LUMIGAN 0.01 % SOLN Place 1 drop into both eyes at bedtime. 01/09/21   [provider]  Multiple Vitamins-Minerals (MULTIVITAMIN MEN 50+ PO) Take by mouth.    [provider]  neomycin-polymyxin b-dexamethasone (MAXITROL) 3.5-10000-0.1 OINT Place 1 application into the left eye 2 (two) times daily. 01/09/21   [provider]  testosterone cypionate (DEPOTESTOSTERONE CYPIONATE) 200 MG/ML injection Inject 200 mg  into the muscle every 28 (twenty-eight) days. 07/22/20   [provider]  vitamin C (ASCORBIC ACID) 500 MG tablet Take 500 mg by mouth daily.    [provider]  vitamin E 180 MG (400 UNITS) capsule Take 400 Units by mouth daily.    [provider]  zinc gluconate 50 MG tablet Take 50 mg by mouth daily.    [provider]      Allergies    Patient has no known allergies.    Review of Systems   Review of Systems  Constitutional:  Negative for fever.  Respiratory:  Negative for shortness of breath.   Cardiovascular:  Negative for chest pain.  Neurological:  Positive for light-headedness. Negative for syncope.    Physical Exam Updated Vital Signs BP 99/70   Pulse (!) 56   Temp (!) 97.5 F (36.4 C) (Oral)   Resp 17   Ht 1.803 m ('5\' 11"'$ )   Wt 91.2 kg   SpO2 97%   BMI 28.03 kg/m  Physical Exam CONSTITUTIONAL: Elderly, no acute distress HEAD: Normocephalic/atraumatic EYES: EOMI ENMT: Mucous membranes moist NECK: supple no meningeal signs CV: Tachycardic and irregular, no loud murmurs LUNGS: Lungs are clear to auscultation bilaterally, no apparent distress ABDOMEN: soft, nontender NEURO: Pt is awake/alert/appropriate, moves all extremitiesx4.  No facial droop.   EXTREMITIES: pulses normal/equal, full ROM SKIN: warm, color normal PSYCH: no abnormalities of mood noted, alert and oriented to situation  ED  Results / Procedures / Treatments   Labs (all labs ordered are listed, but only abnormal results are displayed) Labs Reviewed  BASIC METABOLIC PANEL - Abnormal; Notable for the following components:      Result Value   Glucose, Bld 121 (*)    All other components within normal limits  CBC - Abnormal; Notable for the following components:   RBC 6.43 (*)    Hemoglobin 18.6 (*)    HCT 56.8 (*)    All other components within normal limits    EKG EKG Interpretation  Date/Time:  Thursday February 05 2022 04:57:45 EDT Ventricular Rate:   155 PR Interval:    QRS Duration: 98 QT Interval:  292 QTC Calculation: 469 R Axis:   24 Text Interpretation: Atrial fibrillation with rapid V-rate Probable anteroseptal infarct, old Confirmed by Ripley Fraise [phone]) on 02/05/2022 5:12:47 AM  Radiology DG Chest Portable 1 View  Result Date: 02/05/2022 CLINICAL DATA:  Tachycardia and atrial fibrillation. EXAM: PORTABLE CHEST 1 VIEW COMPARISON:  PA Lat 06/16/2021 FINDINGS: The lungs are slightly emphysematous but clear. No pleural effusion is seen. The cardiac size is normal. The aorta is tortuous with scattered calcific plaques. Stable mediastinum. Thoracic spondylosis. There is overlying monitor wiring. IMPRESSION: No evidence of acute chest process. Stable COPD chest with aortic uncoiling and atherosclerosis. Electronically Signed   By: Telford Nab M.D.   On: 02/05/2022 05:17    Procedures Procedures    Medications Ordered in ED Medications  metoprolol tartrate (LOPRESSOR) injection 5 mg (has no administration in time range)  sodium chloride 0.9 % bolus 1,000 mL (0 mLs Intravenous Stopped 02/05/22 0639)  metoprolol tartrate (LOPRESSOR) injection 2.5 mg (2.5 mg Intravenous Given 02/05/22 0606)  sodium chloride 0.9 % bolus 1,000 mL (1,000 mLs Intravenous New Bag/Given 02/05/22 7035)    ED Course/ Medical Decision Making/ A&P Clinical Course as of 02/05/22 0714  Thu Feb 05, 2022  0600 Patient with known history of paroxysmal atrial fibrillation.  I have reviewed his cardiology notes.  Due to his scoring, he is not a candidate for anticoagulation.  He does have his schedule follow-up with his cardiologist soon as well as electrophysiology as he may need an ablation.  He reports he typically will convert with medications. [DW]  0093 Heart rate is improving, but still in atrial fibrillation.  Patient is receiving IV fluids.  Patient is hesitant to have cardioversion.  He would like to wait another hour or 2 as he has self converted  previously [DW]  0710 Signed out to Dr. Langston Masker.  If patient self converts he can be discharged home [DW]    Clinical Course User Index [DW] Ripley Fraise, MD                           Medical Decision Making Amount and/or Complexity of Data Reviewed Labs: ordered. Radiology: ordered.  Risk Prescription drug management.   This patient presents to the ED for concern of palpitation, this involves an extensive number of treatment options, and is a complaint that carries with it a high risk of complications and morbidity.  The differential diagnosis includes but is not limited to atrial fibrillation, SVT, sinus tachycardia, V. tach  Comorbidities that complicate the patient evaluation: Patient's presentation is complicated by their history of atrial fibrillation  Additional history obtained: Additional history obtained from spouse Records reviewed  cardiology notes  Lab Tests: I Ordered, and personally interpreted labs.  The pertinent results  include: Dehydration  Imaging Studies ordered: I ordered imaging studies including X-ray chest   I independently visualized and interpreted imaging which showed no acute finding I agree with the radiologist interpretation  Cardiac Monitoring: The patient was maintained on a cardiac monitor.  I personally viewed and interpreted the cardiac monitor which showed an underlying rhythm of:  Atrial Fibrillation  Medicines ordered and prescription drug management: I ordered medication including IV fluids for dehydration Metoprolol for A-fib Reevaluation of the patient after these medicines showed that the patient    improved   Reevaluation: After the interventions noted above, I reevaluated the patient and found that they have :improved  Complexity of problems addressed: Patient's presentation is most consistent with  acute presentation with potential threat to life or bodily function          Final Clinical Impression(s) / ED  Diagnoses Final diagnoses:  Paroxysmal atrial fibrillation [facility])    Rx / DC Orders ED Discharge Orders     None         Ripley Fraise, MD 02/05/22 [phone]

## 2022-02-09 DIAGNOSIS — H2513 Age-related nuclear cataract, bilateral: Secondary | ICD-10-CM | POA: Diagnosis not present

## 2022-02-09 DIAGNOSIS — H401133 Primary open-angle glaucoma, bilateral, severe stage: Secondary | ICD-10-CM | POA: Diagnosis not present

## 2022-02-09 NOTE — Progress Notes (Unsigned)
Cardiology Office Note:   Date:  02/10/2022  NAME:  Corey Suarez    MRN: [id number] DOB:  07-07-1951   PCP:  Asencion Noble, MD  Cardiologist:  None  Electrophysiologist:  None   Referring MD: Asencion Noble, MD   Chief Complaint  Patient presents with   Follow-up        History of Present Illness:   ALLAH REASON is a 71 y.o. male with a hx of pAF who presents for follow-up. Seen in ER 7/27 and underwent DCCV.  He reports he had a stressful day when he went to the emergency room.  He was playing with his grandchildren he reports he felt his heart racing and pressure in his chest.  He was back in atrial fibrillation.  His A-fib would not go away with diltiazem.  He went to the emergency room and underwent a cardioversion.  He reports he felt pressure in his chest when in A-fib.  This is a bit alarming.  Echocardiogram was normal.  He is back in sinus rhythm today.  Blood pressure is slightly elevated.  He reports he has not had a diagnosis of high blood pressure in the past.  We discussed starting Eliquis given recent cardioversion and I suspect he ultimately will have CAD.  We also discussed proceeding with coronary CTA to evaluate this further.  He is okay to do this.  His chest x-ray demonstrates possible COPD.  He was a never smoker.  He does have calcified aortic atherosclerosis as well.  Problem List Paroxysmal Afib -06/16/2021 -CHADSVASC=1 (age) 2. Ascending aorta dilation  -43 mm 07/2021  Past Medical History: Past Medical History:  Diagnosis Date   Arrhythmia     Past Surgical History: Past Surgical History:  Procedure Laterality Date   COLONOSCOPY N/A 01/23/2021   Procedure: COLONOSCOPY;  Surgeon: Rogene Houston, MD;  [facility];  Service: Endoscopy;  Laterality: N/A;  [place]     POLYPECTOMY  01/23/2021   Procedure: POLYPECTOMY;  Surgeon: Rogene Houston, MD;  [facility]: [facility];  Service: Endoscopy;;    TONSILLECTOMY      Current Medications: Current Meds  Medication Sig   ALPHAGAN P 0.1 % SOLN Place 1 drop into the left eye 2 (two) times daily.   apixaban (ELIQUIS) 5 MG TABS tablet Take 1 tablet (5 mg total) by mouth 2 (two) times daily.   Cholecalciferol (VITAMIN D3) 125 MCG (5000 UT) TABS Take 5,000 Units by mouth daily.   diltiazem (CARDIZEM) 30 MG tablet Take 1 tablet every 4 hours AS NEEDED for heart rate >100 as long as blood pressure >100.   dronedarone (MULTAQ) 400 MG tablet Take 1 tablet (400 mg total) by mouth 2 (two) times daily with a meal.   latanoprost (XALATAN) 0.005 % ophthalmic solution Place 1 drop into both eyes at bedtime.   testosterone cypionate (DEPOTESTOSTERONE CYPIONATE) 200 MG/ML injection Inject 200 mg into the muscle every 28 (twenty-eight) days.   vitamin C (ASCORBIC ACID) 500 MG tablet Take 500 mg by mouth daily.   vitamin E 180 MG (400 UNITS) capsule Take 400 Units by mouth daily.   zinc gluconate 50 MG tablet Take 50 mg by mouth daily.     Allergies:    Patient has no known allergies.   Social History: Social History   Socioeconomic History   Marital status: Married    Spouse name: 3   Number  of children: Not on file   Years of education: Not on file   Highest education level: Not on file  Occupational History   Occupation: Truck Geophysicist/field seismologist  Tobacco Use   Smoking status: Never   Smokeless tobacco: Never  Vaping Use   Vaping Use: Never used  Substance and Sexual Activity   Alcohol use: Not Currently    Alcohol/week: 1.0 standard drink of alcohol    Types: 1 Standard drinks or equivalent per week    Comment: 1 mixed drink every 65mhs 06/24/21   Drug use: Never   Sexual activity: Not on file  Other Topics Concern   Not on file  Social History Narrative   Not on file   Social Determinants of Health   Financial Resource Strain: Not on file  Food Insecurity: Not on file  Transportation Needs: Not on file  Physical Activity: Not on file   Stress: Not on file  Social Connections: Not on file     Family History: The patient's family history is not on file.  ROS:   All other ROS reviewed and negative. Pertinent positives noted in the HPI.     EKGs/Labs/Other Studies Reviewed:   The following studies were personally reviewed by me today:  TTE 08/04/2021  1. Left ventricular ejection fraction by 3D volume is 57 %. The left  ventricle has normal function. The left ventricle has no regional wall  motion abnormalities. There is mild left ventricular hypertrophy. Left  ventricular diastolic parameters are  consistent with Grade I diastolic dysfunction (impaired relaxation). The  average left ventricular global longitudinal strain is -18.8 %. The global  longitudinal strain is normal.   2. Right ventricular systolic function is normal. The right ventricular  size is normal.   3. The mitral valve is normal in structure. No evidence of mitral valve  regurgitation. No evidence of mitral stenosis.   4. The aortic valve is normal in structure. Aortic valve regurgitation is  not visualized. No aortic stenosis is present.   5. Aortic dilatation noted. There is mild dilatation of the aortic root,  measuring 43 mm. There is mild dilatation of the ascending aorta,  measuring 42 mm.   6. The inferior vena cava is normal in size with greater than 50%  respiratory variability, suggesting right atrial pressure of 3 mmHg.   Recent Labs: 06/16/2021: ALT 36 02/05/2022: BUN 21; Creatinine, Ser 1.22; Hemoglobin 18.6; Platelets 287; Potassium 4.0; Sodium 138   Recent Lipid Panel No results found for: "CHOL", "TRIG", "HDL", "CHOLHDL", "VLDL", "LDLCALC", "LDLDIRECT"  Physical Exam:   VS:  BP (!) 152/78   Pulse 75   Ht '5\' 11"'$  (1.803 m)   Wt 207 lb 6.4 oz (94.1 kg)   SpO2 98%   BMI 28.93 kg/m    Wt Readings from Last 3 Encounters:  02/10/22 207 lb 6.4 oz (94.1 kg)  02/05/22 201 lb (91.2 kg)  10/08/21 207 lb (93.9 kg)    General:  Well nourished, well developed, in no acute distress Head: Atraumatic, normal size  Eyes: PEERLA, EOMI  Neck: Supple, no JVD Endocrine: No thryomegaly Cardiac: Normal S1, S2; RRR; no murmurs, rubs, or gallops Lungs: Clear to auscultation bilaterally, no wheezing, rhonchi or rales  Abd: Soft, nontender, no hepatomegaly  Ext: No edema, pulses 2+ Musculoskeletal: No deformities, BUE and BLE strength normal and equal Skin: Warm and dry, no rashes   Neuro: Alert and oriented to person, place, time, and situation, CNII-XII grossly intact, no focal deficits  Psych: Normal mood and affect   ASSESSMENT:   TRUSTIN CHAPA is a 71 y.o. male who presents for the following: 1. Precordial pain   2. Persistent atrial fibrillation ([place])     PLAN:   1. Precordial pain -Reports a chest pressure when in A-fib.  This is concerning.  Echo was normal.  I think he needs a coronary CTA to exclude CAD.  He has diltiazem at home.  He will take 120 mg of diltiazem 2 hours before the CTA.  This will give [place] a good scan.  Recent BMP is acceptable.  2. Persistent atrial fibrillation (HCC) -Continues to have atrial fibrillation episodes.  This is the fourth episode.  Presented to the emergency room and underwent cardioversion.  CHA2DS2-VASc equals 1.  His blood pressure is elevated today but he informs me he has never had high blood pressure.  Given his recent cardioversion I would recommend he complete 4 weeks of Eliquis.  If he ends up having high blood pressure or CAD he will need long-term anticoagulation.  He is okay to start this today.  No bleeding to be concerned about. -Given his chest pressure and recurrent atrial fibrillation I recommended we start Multaq 400 mg twice daily.  This will allow for rhythm control strategy until he can see electrophysiology.  I think a cardiac ablation would be warranted.  We should be able to get enough information on the coronary CTA for a cardiac ablation. -He will  continue with diltiazem as well if heart rate does not improve.  However I believe rhythm control strategy is the best option for now until he can be evaluated by EP.      Disposition: Return in about 2 months (around 04/12/2022).  Medication Adjustments/Labs and Tests Ordered: Current medicines are reviewed at length with the patient today.  Concerns regarding medicines are outlined above.  Orders Placed This Encounter  Procedures   CT CORONARY MORPH W/CTA COR W/SCORE W/CA W/CM &/OR WO/CM   Meds ordered this encounter  Medications   apixaban (ELIQUIS) 5 MG TABS tablet    Sig: Take 1 tablet (5 mg total) by mouth 2 (two) times daily.    Dispense:  60 tablet    Refill:  3   dronedarone (MULTAQ) 400 MG tablet    Sig: Take 1 tablet (400 mg total) by mouth 2 (two) times daily with a meal.    Dispense:  60 tablet    Refill:  3    Patient Instructions  Medication Instructions:  Take 4 tablets of Diltiazem (120 mg) 2 hours before the CT when scheduled.  START Multaq 400 mg twice daily  START Eliquis 5 mg twice daily   *If you need a refill on your cardiac medications before your next appointment, please call your pharmacy*   Testing/Procedures: Your physician has requested that you have cardiac CT. Cardiac computed tomography (CT) is a painless test that uses an x-ray machine to take clear, detailed pictures of your heart. For further information please visit [email or web address].[email or web address]. Please follow instruction sheet as given.    Follow-Up: At [facility], you and your health needs are our priority.  As part of our continuing mission to provide you with exceptional heart care, we have created designated Provider Care Teams.  These Care Teams include your primary Cardiologist (physician) and Advanced Practice Providers (APPs -  Physician Assistants and Nurse Practitioners) who all work together to provide you with the care you need, when you need it.  We  recommend signing up for the  patient portal called "MyChart".  Sign up information is provided on this After Visit Summary.  MyChart is used to connect with patients for Virtual Visits (Telemedicine).  Patients are able to view lab/test results, encounter notes, upcoming appointments, etc.  Non-urgent messages can be sent to your provider as well.   To learn more about what you can do with MyChart, go to [email or web address].    Your next appointment:   2 month(s)  The format for your next appointment:   In Person  Provider:   Eleonore Chiquito, MD     Other Instructions   Your cardiac CT will be scheduled at one of the below locations:   [facility] [address] [place], [place] [address] [phone]  If scheduled at [facility], please arrive at the [facility] and Children's Entrance (Entrance C2) of [facility] 30 minutes prior to test start time. You can use the FREE valet parking offered at entrance C (encouraged to control the heart rate for the test)  Proceed to the [facility] Radiology Department (first floor) to check-in and test prep.  All radiology patients and guests should use entrance C2 at [facility], accessed from [facility], even though the hospital's physical address listed is [address].    Please follow these instructions carefully (unless otherwise directed):  Hold all erectile dysfunction medications at least 3 days (72 hrs) prior to test.  On the Night Before the Test: Be sure to Drink plenty of water. Do not consume any caffeinated/decaffeinated beverages or chocolate 12 hours prior to your test. Do not take any antihistamines 12 hours prior to your test.  On the Day of the Test: Drink plenty of water until 1 hour prior to the test. Do not eat any food 4 hours prior to the test. You may take your regular medications prior to the test.  Take Diltiazem 4 tablets (120 mg) 2 hours before the test. HOLD  Furosemide/Hydrochlorothiazide morning of the test.      After the Test: Drink plenty of water. After receiving IV contrast, you may experience a mild flushed feeling. This is normal. On occasion, you may experience a mild rash up to 24 hours after the test. This is not dangerous. If this occurs, you can take Benadryl 25 mg and increase your fluid intake. If you experience trouble breathing, this can be serious. If it is severe call 911 IMMEDIATELY. If it is mild, please call our office. If you take any of these medications: Glipizide/Metformin, Avandament, Glucavance, please do not take 48 hours after completing test unless otherwise instructed.  We will call to schedule your test 2-4 weeks out understanding that some insurance companies will need an authorization prior to the service being performed.   For non-scheduling related questions, please contact the cardiac imaging nurse navigator should you have any questions/concerns: Marchia Bond, Cardiac Imaging Nurse Navigator Gordy Clement, Cardiac Imaging Nurse Navigator [facility] Direct Office Dial: [phone]   For scheduling needs, including cancellations and rescheduling, please call [place], [phone].           Time Spent with Patient: I have spent a total of 35 minutes with patient reviewing hospital notes, telemetry, EKGs, labs and examining the patient as well as establishing an assessment and plan that was discussed with the patient.  > 50% of time was spent in direct patient care.  Signed, Addison Naegeli. Audie Box, MD,  [place]  [address] [place], [place] [address] [phone]  02/10/2022 6:53 PM

## 2022-02-10 ENCOUNTER — Encounter: Payer: Self-pay | Admitting: Cardiovascular Disease

## 2022-02-10 ENCOUNTER — Ambulatory Visit: Payer: Medicare Other | Admitting: Cardiovascular Disease

## 2022-02-10 VITALS — BP 152/78 | HR 75 | Ht 71.0 in | Wt 207.4 lb

## 2022-02-10 DIAGNOSIS — I4819 Other persistent atrial fibrillation: Secondary | ICD-10-CM

## 2022-02-10 DIAGNOSIS — R072 Precordial pain: Secondary | ICD-10-CM

## 2022-02-10 MED ORDER — DRONEDARONE HCL 400 MG PO TABS: 400.0000 mg | ORAL_TABLET | Freq: Two times a day (BID) | ORAL | 3 refills | Status: DC

## 2022-02-10 MED ORDER — APIXABAN 5 MG PO TABS: 5.0000 mg | ORAL_TABLET | Freq: Two times a day (BID) | ORAL | 3 refills | Status: DC

## 2022-02-10 NOTE — Patient Instructions (Addendum)
Medication Instructions:  Take 4 tablets of Diltiazem (120 mg) 2 hours before the CT when scheduled.  START Multaq 400 mg twice daily  START Eliquis 5 mg twice daily   *If you need a refill on your cardiac medications before your next appointment, please call your pharmacy*   Testing/Procedures: Your physician has requested that you have cardiac CT. Cardiac computed tomography (CT) is a painless test that uses an x-ray machine to take clear, detailed pictures of your heart. For further information please visit HugeFiesta.[email or web address]. Please follow instruction sheet as given.    Follow-Up: At [facility], you and your health needs are our priority.  As part of our continuing mission to provide you with exceptional heart care, we have created designated Provider Care Teams.  These Care Teams include your primary Cardiologist (physician) and Advanced Practice Providers (APPs -  Physician Assistants and Nurse Practitioners) who all work together to provide you with the care you need, when you need it.  We recommend signing up for the patient portal called "MyChart".  Sign up information is provided on this After Visit Summary.  MyChart is used to connect with patients for Virtual Visits (Telemedicine).  Patients are able to view lab/test results, encounter notes, upcoming appointments, etc.  Non-urgent messages can be sent to your provider as well.   To learn more about what you can do with MyChart, go to [email or web address].    Your next appointment:   2 month(s)  The format for your next appointment:   In Person  Provider:   Eleonore Chiquito, MD     Other Instructions   Your cardiac CT will be scheduled at one of the below locations:   [facility] [address] [place], [place] [address] [phone]  If scheduled at [facility], please arrive at the [facility] and Children's Entrance (Entrance C2) of [facility] 30 minutes prior to test start  time. You can use the FREE valet parking offered at entrance C (encouraged to control the heart rate for the test)  Proceed to the [facility] Radiology Department (first floor) to check-in and test prep.  All radiology patients and guests should use entrance C2 at [facility], accessed from [facility], even though the hospital's physical address listed is [address]    Please follow these instructions carefully (unless otherwise directed):  Hold all erectile dysfunction medications at least 3 days (72 hrs) prior to test.  On the Night Before the Test: Be sure to Drink plenty of water. Do not consume any caffeinated/decaffeinated beverages or chocolate 12 hours prior to your test. Do not take any antihistamines 12 hours prior to your test.  On the Day of the Test: Drink plenty of water until 1 hour prior to the test. Do not eat any food 4 hours prior to the test. You may take your regular medications prior to the test.  Take Diltiazem 4 tablets (120 mg) 2 hours before the test. HOLD Furosemide/Hydrochlorothiazide morning of the test.      After the Test: Drink plenty of water. After receiving IV contrast, you may experience a mild flushed feeling. This is normal. On occasion, you may experience a mild rash up to 24 hours after the test. This is not dangerous. If this occurs, you can take Benadryl 25 mg and increase your fluid intake. If you experience trouble breathing, this can be serious. If it is severe call 911 IMMEDIATELY. If it is mild, please  call our office. If you take any of these medications: Glipizide/Metformin, Avandament, Glucavance, please do not take 48 hours after completing test unless otherwise instructed.  We will call to schedule your test 2-4 weeks out understanding that some insurance companies will need an authorization prior to the service being performed.   For non-scheduling related questions, please contact the cardiac  imaging nurse navigator should you have any questions/concerns: Marchia Bond, Cardiac Imaging Nurse Navigator Gordy Clement, Cardiac Imaging Nurse Navigator [facility] Direct Office Dial: [phone]   For scheduling needs, including cancellations and rescheduling, please call [place], [phone].

## 2022-03-02 ENCOUNTER — Telehealth (HOSPITAL_COMMUNITY): Payer: Self-pay | Admitting: Emergency Medicine

## 2022-03-02 NOTE — Telephone Encounter (Signed)
Reaching out to patient to offer assistance regarding upcoming cardiac imaging study; pt verbalizes understanding of appt date/time, parking situation and where to check in, pre-test NPO status and medications ordered, and verified current allergies; name and call back number provided for further questions should they arise Marchia Bond RN Navigator Cardiac Imaging [facility] [phone] office [phone] cell  Denies iv issues Arrival 900 w/c entrance Aware nitro Taking '120mg'$  diltiazem

## 2022-03-03 ENCOUNTER — Ambulatory Visit (HOSPITAL_COMMUNITY)
Admission: RE | Admit: 2022-03-03 | Discharge: 2022-03-03 | Disposition: A | Discharge status: Home / Self Care | Payer: Medicare Other | Source: Ambulatory Visit | Attending: Cardiovascular Disease | Admitting: Cardiovascular Disease

## 2022-03-03 DIAGNOSIS — R072 Precordial pain: Secondary | ICD-10-CM | POA: Insufficient documentation

## 2022-03-03 MED ORDER — NITROGLYCERIN 0.4 MG SL SUBL
0.8000 mg | SUBLINGUAL_TABLET | Freq: Once | SUBLINGUAL | Status: AC
  Administered 2022-03-03: 0.8 mg via SUBLINGUAL

## 2022-03-03 MED ORDER — NITROGLYCERIN 0.4 MG SL SUBL
SUBLINGUAL_TABLET | SUBLINGUAL | Status: AC
  Filled 2022-03-03: qty 2

## 2022-03-03 MED ORDER — IOHEXOL 350 MG/ML SOLN
100.0000 mL | Freq: Once | INTRAVENOUS | Status: AC | PRN
  Administered 2022-03-03: 100 mL via INTRAVENOUS

## 2022-03-22 NOTE — Progress Notes (Unsigned)
Electrophysiology Office Note:    Date:  03/22/2022   ID:  Corey Suarez, DOB 1950/12/11, MRN [id number]  PCP:  Asencion Noble, MD  [facility] Cardiologist:  None  [facility] Electrophysiologist:  None   Referring MD: Geralynn Rile, *   Chief Complaint: Persistent atrial fibrillation  History of Present Illness:    Corey Suarez is a 71 y.o. male who presents for an evaluation of persistent atrial fibrillation at the request of Dr. Audie Box. Their medical history includes persistent atrial fibrillation, ascending aortic dilation, possible COPD, aortic atherosclerosis.  The patient last saw Dr. Audie Box in clinic February 10, 2022.  The patient was seen in the emergency department July 27 for symptomatic atrial fibrillation and underwent cardioversion.  His atrial fibrillation started when he was playing with his grandchildren on that occasion.  He was first diagnosed with atrial fibrillation in December 2022 during a work physical.  He was referred to the ER but had converted back to sinus rhythm by the time he was evaluated in the emergency department.  He tried diltiazem at home but it did not help the symptoms so he went to the ER where he was ultimately cardioverted.  He also reported chest pressure and a coronary CTA was ordered.  He has had at least 4 episodes of atrial fibrillation according to Dr. Kathalene Frames note.  He has been on Eliquis for stroke prophylaxis.     Past Medical History:  Diagnosis Date   Arrhythmia     Past Surgical History:  Procedure Laterality Date   COLONOSCOPY N/A 01/23/2021   Procedure: COLONOSCOPY;  Surgeon: Rogene Houston, MD;  [facility];  Service: Endoscopy;  Laterality: N/A;  930   EYE SURGERY     HERNIA REPAIR     POLYPECTOMY  01/23/2021   Procedure: POLYPECTOMY;  Surgeon: Rogene Houston, MD;  [facility];  Service: Endoscopy;;   TONSILLECTOMY      Current Medications: No outpatient medications have  been marked as taking for the 03/23/22 encounter (Appointment) with Vickie Epley, MD.     Allergies:   Patient has no known allergies.   Social History   Socioeconomic History   Marital status: Married    Spouse name: 3   Number of children: Not on file   Years of education: Not on file   Highest education level: Not on file  Occupational History   Occupation: Truck Geophysicist/field seismologist  Tobacco Use   Smoking status: Never   Smokeless tobacco: Never  Vaping Use   Vaping Use: Never used  Substance and Sexual Activity   Alcohol use: Not Currently    Alcohol/week: 1.0 standard drink of alcohol    Types: 1 Standard drinks or equivalent per week    Comment: 1 mixed drink every 76mhs 06/24/21   Drug use: Never   Sexual activity: Not on file  Other Topics Concern   Not on file  Social History Narrative   Not on file   Social Determinants of Health   Financial Resource Strain: Not on file  Food Insecurity: Not on file  Transportation Needs: Not on file  Physical Activity: Not on file  Stress: Not on file  Social Connections: Not on file     Family History: The patient's family history is not on file.  ROS:   Please see the history of present illness.    All other systems reviewed and are negative.  EKGs/Labs/Other Studies Reviewed:  The following studies were reviewed today:  March 03, 2022 coronary CTA Dilation of the ascending thoracic aorta.  No obstructive coronary artery disease.  August 04, 2021 echo EF 57% Mild LVH RV normal     Recent Labs: 06/16/2021: ALT 36 02/05/2022: BUN 21; Creatinine, Ser 1.22; Hemoglobin 18.6; Platelets 287; Potassium 4.0; Sodium 138  Recent Lipid Panel No results found for: "CHOL", "TRIG", "HDL", "CHOLHDL", "VLDL", "LDLCALC", "LDLDIRECT"  Physical Exam:    VS:  There were no vitals taken for this visit.    Wt Readings from Last 3 Encounters:  02/10/22 207 lb 6.4 oz (94.1 kg)  02/05/22 201 lb (91.2 kg)  10/08/21 207 lb  (93.9 kg)     GEN: *** Well nourished, well developed in no acute distress HEENT: Normal NECK: No JVD; No carotid bruits LYMPHATICS: No lymphadenopathy CARDIAC: ***RRR, no murmurs, rubs, gallops RESPIRATORY:  Clear to auscultation without rales, wheezing or rhonchi  ABDOMEN: Soft, non-tender, non-distended MUSCULOSKELETAL:  No edema; No deformity  SKIN: Warm and dry NEUROLOGIC:  Alert and oriented x 3 PSYCHIATRIC:  Normal affect       ASSESSMENT:    1. Persistent atrial fibrillation ([name])   2. Dilation of aorta (HCC)    PLAN:    In order of problems listed above:  #Persistent atrial fibrillation On Eliquis for stroke prophylaxis. Takes diltiazem and dronedarone for rhythm control.  Discussed treatment options today for his AF including antiarrhythmic drug therapy and ablation. Discussed risks, recovery and likelihood of success. Discussed potential need for repeat ablation procedures and antiarrhythmic drugs after an initial ablation. They wish to proceed with scheduling.  Risk, benefits, and alternatives to EP study and radiofrequency ablation for afib were also discussed in detail today. These risks include but are not limited to stroke, bleeding, vascular damage, tamponade, perforation, damage to the esophagus, lungs, and other structures, pulmonary vein stenosis, worsening renal function, and death. The patient understands these risk and wishes to proceed.  We will therefore proceed with catheter ablation at the next available time.  Carto, ICE, anesthesia are requested for the procedure.  Will also obtain CT PV protocol prior to the procedure to exclude LAA thrombus and further evaluate atrial anatomy.   Total time spent with patient today *** minutes. This includes reviewing records, evaluating the patient and coordinating care.  Medication Adjustments/Labs and Tests Ordered: Current medicines are reviewed at length with the patient today.  Concerns regarding medicines  are outlined above.  No orders of the defined types were placed in this encounter.  No orders of the defined types were placed in this encounter.    Signed, Hilton Cork. Quentin Ore, MD, [facility], [facility] 03/22/2022 9:04 PM    Electrophysiology [facility]

## 2022-03-23 ENCOUNTER — Ambulatory Visit: Payer: Medicare Other | Attending: Cardiology | Admitting: Cardiology

## 2022-03-23 ENCOUNTER — Encounter: Payer: Self-pay | Admitting: Cardiology

## 2022-03-23 ENCOUNTER — Encounter: Payer: Self-pay | Admitting: *Deleted

## 2022-03-23 VITALS — BP 110/66 | HR 64 | Ht 71.0 in | Wt 207.4 lb

## 2022-03-23 DIAGNOSIS — Z01818 Encounter for other preprocedural examination: Secondary | ICD-10-CM | POA: Diagnosis not present

## 2022-03-23 DIAGNOSIS — I4819 Other persistent atrial fibrillation: Secondary | ICD-10-CM

## 2022-03-23 DIAGNOSIS — I77819 Aortic ectasia, unspecified site: Secondary | ICD-10-CM

## 2022-03-23 NOTE — Progress Notes (Signed)
Electrophysiology Office Note:    Date:  03/23/2022   ID:  Corey Suarez, DOB May 01, 1951, MRN [id number]  PCP:  Asencion Noble, MD  [facility] Cardiologist:  None  [facility] Electrophysiologist:  Vickie Epley, MD   Referring MD: Geralynn Rile, *   Chief Complaint: Persistent atrial fibrillation  History of Present Illness:    Corey Suarez is a 71 y.o. male who presents for an evaluation of persistent atrial fibrillation at the request of Dr. Audie Box. Their medical history includes persistent atrial fibrillation, ascending aortic dilation, possible COPD, aortic atherosclerosis.  The patient last saw Dr. Audie Box in clinic February 10, 2022.  The patient was seen in the emergency department July 27 for symptomatic atrial fibrillation and underwent cardioversion.  His atrial fibrillation started when he was playing with his grandchildren on that occasion.  He was first diagnosed with atrial fibrillation in December 2022 during a work physical.  He was referred to the ER but had converted back to sinus rhythm by the time he was evaluated in the emergency department.  He tried diltiazem at home but it did not help the symptoms so he went to the ER where he was ultimately cardioverted.  He also reported chest pressure and a coronary CTA was ordered.  He has had at least 4 episodes of atrial fibrillation according to Dr. Kathalene Frames note.  He has been on Eliquis for stroke prophylaxis.  Today, he is accompanied by a family member  Known triggers of his arrhythmic episodes include strenuous activity and becoming upset/angry. During those times he will notice his heart rate will be higher such as 118 bpm; later the same night his heart rate spikes to 150-161 bpm. Usually he is sleeping when it occurs. In the past he would have a nightmare that he was trapped in a tunnel or was drowning. He would wake up with night sweats. However, these dreams would occur only sleeping at home, and  not in his truck. He is unsure if they truly correlate with his arrhythmias.  He is compliant with his Eliquis; no bleeding issues.  He has successfully lost weight. Previously he was 221 lbs, currently 204-205 lbs at home. His goal weight is 190 lbs. Generally he follows a healthy diet and is working on increasing his exercise.  Prior testing for sleep apnea was negative.  He denies any chest pain, shortness of breath, or peripheral edema. No lightheadedness, headaches, syncope, orthopnea, or PND.     Past Medical History:  Diagnosis Date   Arrhythmia     Past Surgical History:  Procedure Laterality Date   COLONOSCOPY N/A 01/23/2021   Procedure: COLONOSCOPY;  Surgeon: Rogene Houston, MD;  [facility];  Service: Endoscopy;  Laterality: N/A;  [place]     POLYPECTOMY  01/23/2021   Procedure: POLYPECTOMY;  Surgeon: Rogene Houston, MD;  [facility];  Service: Endoscopy;;   TONSILLECTOMY      Current Medications: Current Meds  Medication Sig   ALPHAGAN P 0.1 % SOLN Place 1 drop into the left eye 2 (two) times daily.   apixaban (ELIQUIS) 5 MG TABS tablet Take 1 tablet (5 mg total) by mouth 2 (two) times daily.   Cholecalciferol (VITAMIN D3) 125 MCG (5000 UT) TABS Take 5,000 Units by mouth daily.   diltiazem (CARDIZEM) 30 MG tablet Take 1 tablet every 4 hours AS NEEDED for heart rate >100 as  long as blood pressure >100.   dronedarone (MULTAQ) 400 MG tablet Take 1 tablet (400 mg total) by mouth 2 (two) times daily with a meal.   latanoprost (XALATAN) 0.005 % ophthalmic solution Place 1 drop into both eyes at bedtime.   testosterone cypionate (DEPOTESTOSTERONE CYPIONATE) 200 MG/ML injection Inject 200 mg into the muscle every 28 (twenty-eight) days.   vitamin C (ASCORBIC ACID) 500 MG tablet Take 500 mg by mouth daily.   vitamin E 180 MG (400 UNITS) capsule Take 400 Units by mouth daily.   zinc gluconate 50 MG tablet Take 50 mg by  mouth daily.     Allergies:   Patient has no known allergies.   Social History   Socioeconomic History   Marital status: Married    Spouse name: 3   Number of children: Not on file   Years of education: Not on file   Highest education level: Not on file  Occupational History   Occupation: Truck Geophysicist/field seismologist  Tobacco Use   Smoking status: Never   Smokeless tobacco: Never  Vaping Use   Vaping Use: Never used  Substance and Sexual Activity   Alcohol use: Not Currently    Alcohol/week: 1.0 standard drink of alcohol    Types: 1 Standard drinks or equivalent per week    Comment: 1 mixed drink every 83mhs 06/24/21   Drug use: Never   Sexual activity: Not on file  Other Topics Concern   Not on file  Social History Narrative   Not on file   Social Determinants of Health   Financial Resource Strain: Not on file  Food Insecurity: Not on file  Transportation Needs: Not on file  Physical Activity: Not on file  Stress: Not on file  Social Connections: Not on file     Family History: The patient's family history is not on file.  ROS:   Please see the history of present illness.    All other systems reviewed and are negative.  EKGs/Labs/Other Studies Reviewed:    The following studies were reviewed today:  March 03, 2022 coronary CTA Dilation of the ascending thoracic aorta.  No obstructive coronary artery disease.   August 04, 2021 echo EF 57% Mild LVH RV normal    EKG:   EKG is personally reviewed.  03/23/2022:  EKG was not ordered.   Recent Labs: 06/16/2021: ALT 36 02/05/2022: BUN 21; Creatinine, Ser 1.22; Hemoglobin 18.6; Platelets 287; Potassium 4.0; Sodium 138   Recent Lipid Panel No results found for: "CHOL", "TRIG", "HDL", "CHOLHDL", "VLDL", "LDLCALC", "LDLDIRECT"  Physical Exam:    VS:  BP 110/66   Pulse 64   Ht '5\' 11"'$  (1.803 m)   Wt 207 lb 6.4 oz (94.1 kg)   SpO2 95%   BMI 28.93 kg/m     Wt Readings from Last 3 Encounters:  03/23/22 207 lb 6.4  oz (94.1 kg)  02/10/22 207 lb 6.4 oz (94.1 kg)  02/05/22 201 lb (91.2 kg)     GEN: Well nourished, well developed in no acute distress HEENT: Normal NECK: No JVD; No carotid bruits LYMPHATICS: No lymphadenopathy CARDIAC: RRR, no murmurs, rubs, gallops RESPIRATORY:  Clear to auscultation without rales, wheezing or rhonchi  ABDOMEN: Soft, non-tender, non-distended MUSCULOSKELETAL:  No edema; No deformity  SKIN: Warm and dry NEUROLOGIC:  Alert and oriented x 3 PSYCHIATRIC:  Normal affect       ASSESSMENT:    1. Persistent atrial fibrillation ([place]   2. Dilation of aorta (HCC)  3. Pre-op evaluation    PLAN:    In order of problems listed above:  #Persistent atrial fibrillation On Eliquis for stroke prophylaxis. Takes diltiazem and dronedarone for rhythm control.   Discussed treatment options today for his AF including antiarrhythmic drug therapy and ablation. Discussed risks, recovery and likelihood of success. Discussed potential need for repeat ablation procedures and antiarrhythmic drugs after an initial ablation. They wish to proceed with scheduling.   Risk, benefits, and alternatives to EP study and radiofrequency ablation for afib were also discussed in detail today. These risks include but are not limited to stroke, bleeding, vascular damage, tamponade, perforation, damage to the esophagus, lungs, and other structures, pulmonary vein stenosis, worsening renal function, and death. The patient understands these risk and wishes to proceed.  We will therefore proceed with catheter ablation at the next available time.  Carto, ICE, anesthesia are requested for the procedure.  Will also obtain CT PV protocol prior to the procedure to exclude LAA thrombus and further evaluate atrial anatomy.   Medication Adjustments/Labs and Tests Ordered: Current medicines are reviewed at length with the patient today.  Concerns regarding medicines are outlined above.  Orders Placed This  Encounter  Procedures   CBC w/Diff   Basic Metabolic Panel (BMET)   No orders of the defined types were placed in this encounter.   I,Mathew Stumpf,acting as a Education administrator for Vickie Epley, MD.,have documented all relevant documentation on the behalf of Vickie Epley, MD,as directed by  Vickie Epley, MD while in the presence of Vickie Epley, MD.  I, Vickie Epley, MD, have reviewed all documentation for this visit. The documentation on 03/23/22 for the exam, diagnosis, procedures, and orders are all accurate and complete.   Signed, Hilton Cork. Quentin Ore, MD, [facility], [facility] 03/23/2022 1:43 PM    Electrophysiology  [facility]

## 2022-03-23 NOTE — Patient Instructions (Signed)
Medication Instructions:  none *If you need a refill on your cardiac medications before your next appointment, please call your pharmacy*   Lab Work: none If you have labs (blood work) drawn today and your tests are completely normal, you will receive your results only by: [place] (if you have MyChart) OR A paper copy in the mail If you have any lab test that is abnormal or we need to change your treatment, we will call you to review the results.   Testing/Procedures: Your physician has requested that you have cardiac CT. Cardiac computed tomography (CT) is a painless test that uses an x-ray machine to take clear, detailed pictures of your heart. For further information please visit [email or web address]. Please follow instruction sheet as given.  Your physician has recommended that you have an ablation. Catheter ablation is a medical procedure used to treat some cardiac arrhythmias (irregular heartbeats). During catheter ablation, a long, thin, flexible tube is put into a blood vessel in your groin (upper thigh), or neck. This tube is called an ablation catheter. It is then guided to your heart through the blood vessel. Radio frequency waves destroy small areas of heart tissue where abnormal heartbeats may cause an arrhythmia to start. Please see the instruction sheet given to you today.     Follow-Up: At [facility], you and your health needs are our priority.  As part of our continuing mission to provide you with exceptional heart care, we have created designated Provider Care Teams.  These Care Teams include your primary Cardiologist (physician) and Advanced Practice Providers (APPs -  Physician Assistants and Nurse Practitioners) who all work together to provide you with the care you need, when you need it.  We recommend signing up for the patient portal called "MyChart".  Sign up information is provided on this After Visit Summary.  MyChart is used to connect with  patients for Virtual Visits (Telemedicine).  Patients are able to view lab/test results, encounter notes, upcoming appointments, etc.  Non-urgent messages can be sent to your provider as well.   To learn more about what you can do with MyChart, go to [email or web address].    Your next appointment:   See instruction letter.   Important Information About Sugar

## 2022-04-16 DIAGNOSIS — H2511 Age-related nuclear cataract, right eye: Secondary | ICD-10-CM | POA: Diagnosis not present

## 2022-04-17 DIAGNOSIS — H2512 Age-related nuclear cataract, left eye: Secondary | ICD-10-CM | POA: Diagnosis not present

## 2022-04-30 ENCOUNTER — Ambulatory Visit: Payer: Medicare Other | Admitting: Cardiovascular Disease

## 2022-05-07 DIAGNOSIS — H2512 Age-related nuclear cataract, left eye: Secondary | ICD-10-CM | POA: Diagnosis not present

## 2022-05-07 DIAGNOSIS — H25042 Posterior subcapsular polar age-related cataract, left eye: Secondary | ICD-10-CM | POA: Diagnosis not present

## 2022-05-12 ENCOUNTER — Telehealth: Payer: Self-pay | Admitting: Cardiovascular Disease

## 2022-05-12 NOTE — Telephone Encounter (Signed)
Called and informed wife Dr Jenetta DownerNori Riis would like proceed with  patient assistance first.    To  one month saving card  left for patient to use. Wife states they did not use  the cards initial and she threw  them away .   She verbalized understanding to bring completed form back to  submitted as soon as she can.

## 2022-05-12 NOTE — Telephone Encounter (Signed)
  Pt c/o medication issue:  1. Name of Medication: apixaban (ELIQUIS) 5 MG TABS tablet dronedarone (MULTAQ) 400 MG tablet  2. How are you currently taking this medication (dosage and times per day)? As written  3. Are you having a reaction (difficulty breathing--STAT)? No   4. What is your medication issue? Pt's wife is calling, asking if Dr. Audie Box can call in a generic for eliquis and multaq to pt's pharmacy on file

## 2022-05-12 NOTE — Telephone Encounter (Signed)
Called patient 's wife. She states that both medication have tripled in price last month.  Informed Mrs Reiley  that both medication do not have a generic.   Offered her patient assistance form to  fill out. She states she will pick it up from the office. She states they live in [place].   She is aware she will need to fill out information and bring to the office  to be sent to  drug company.  Aware will defer to Dr Audie Box if there is other options

## 2022-05-16 ENCOUNTER — Other Ambulatory Visit: Payer: Self-pay | Admitting: Cardiovascular Disease

## 2022-05-18 NOTE — Telephone Encounter (Signed)
Prescription refill request for Eliquis received. Indication: PAF Last office visit: 03/23/22  Grayce Sessions MD Scr: 1.22 on 02/05/22 Age: 71 Weight: 94.1kg  Based on above findings Eliquis '5mg'$  twice daily is the appropriate dose.  Refill approved.

## 2022-05-22 ENCOUNTER — Ambulatory Visit: Payer: Medicare Other | Attending: Cardiology

## 2022-05-22 DIAGNOSIS — I4819 Other persistent atrial fibrillation: Secondary | ICD-10-CM

## 2022-05-22 DIAGNOSIS — Z01818 Encounter for other preprocedural examination: Secondary | ICD-10-CM | POA: Diagnosis not present

## 2022-05-22 DIAGNOSIS — I77819 Aortic ectasia, unspecified site: Secondary | ICD-10-CM | POA: Diagnosis not present

## 2022-05-23 LAB — BASIC METABOLIC PANEL
BUN/Creatinine Ratio: 12 (ref 10–24)
BUN: 17 mg/dL (ref 8–27)
CO2: 23 mmol/L (ref 20–29)
Calcium: 9.5 mg/dL (ref 8.6–10.2)
Chloride: 104 mmol/L (ref 96–106)
Creatinine, Ser: 1.39 mg/dL — ABNORMAL HIGH (ref 0.76–1.27)
Glucose: 74 mg/dL (ref 70–99)
Potassium: 4.5 mmol/L (ref 3.5–5.2)
Sodium: 141 mmol/L (ref 134–144)
eGFR: 54 mL/min/{1.73_m2} — ABNORMAL LOW (ref >59)

## 2022-05-23 LAB — CBC WITH DIFFERENTIAL/PLATELET
Basophils Absolute: 0.1 10*3/uL (ref 0.0–0.2)
Basos: 1 %
EOS (ABSOLUTE): 0.2 10*3/uL (ref 0.0–0.4)
Eos: 2 %
Hematocrit: 48.9 % (ref 37.5–51.0)
Hemoglobin: 16.6 g/dL (ref 13.0–17.7)
Immature Grans (Abs): 0.1 10*3/uL (ref 0.0–0.1)
Immature Granulocytes: 1 %
Lymphocytes Absolute: 1.6 10*3/uL (ref 0.7–3.1)
Lymphs: 16 %
MCH: 29.4 pg (ref 26.6–33.0)
MCHC: 33.9 g/dL (ref 31.5–35.7)
MCV: 87 fL (ref 79–97)
Monocytes Absolute: 1 10*3/uL — ABNORMAL HIGH (ref 0.1–0.9)
Monocytes: 10 %
Neutrophils Absolute: 7.2 10*3/uL — ABNORMAL HIGH (ref 1.4–7.0)
Neutrophils: 70 %
Platelets: 350 10*3/uL (ref 150–450)
RBC: 5.65 x10E6/uL (ref 4.14–5.80)
RDW: 13.4 % (ref 11.6–15.4)
WBC: 10.1 10*3/uL (ref 3.4–10.8)

## 2022-05-25 ENCOUNTER — Encounter: Payer: Self-pay | Admitting: *Deleted

## 2022-05-25 IMAGING — CR DG CHEST 2V
2 series · 2 of 2 positions shown · non-contrast
Comparison: 05/16/2009

CLINICAL DATA: Chest pain

EXAM:
CHEST - 2 VIEW

[w chest pa]
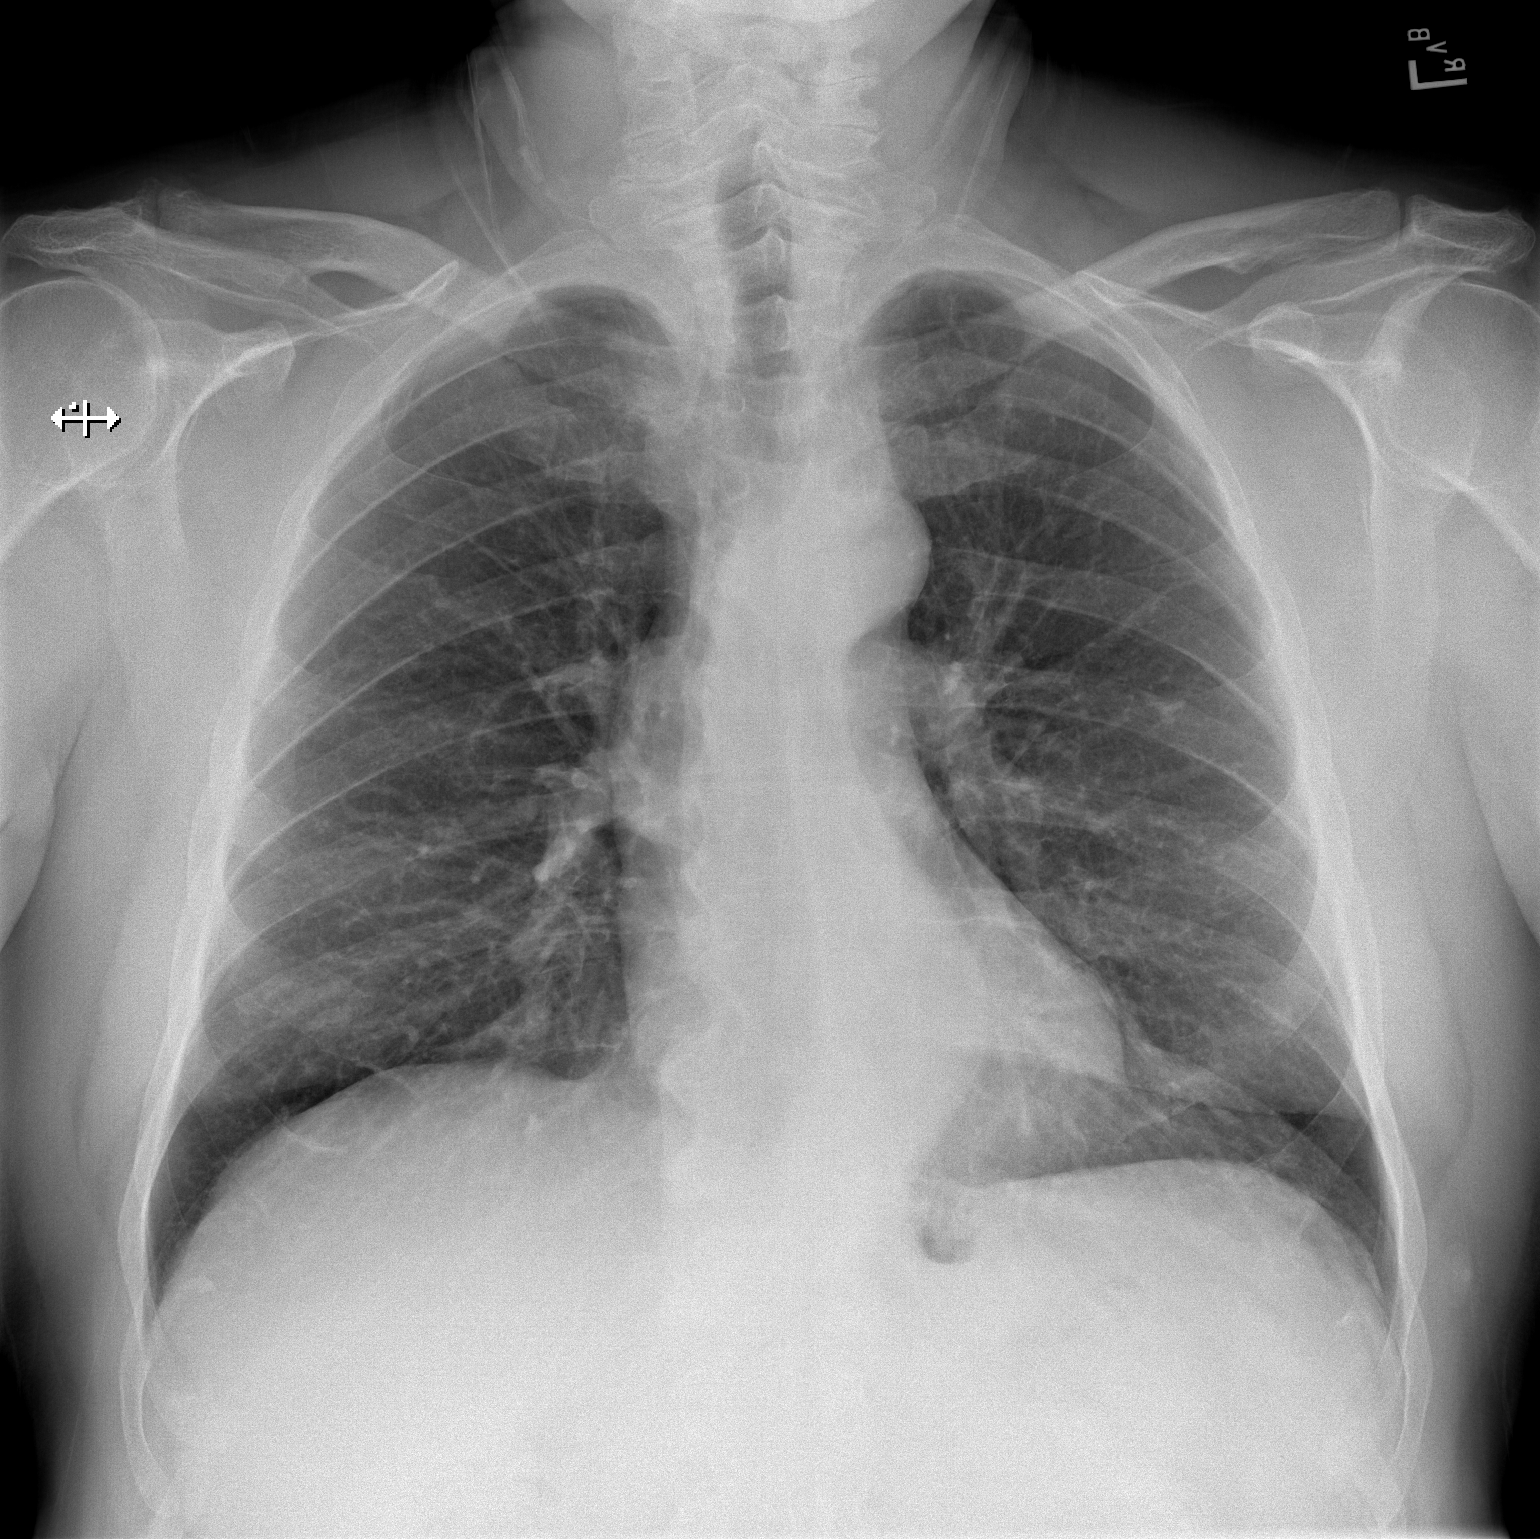

[w chest lat]
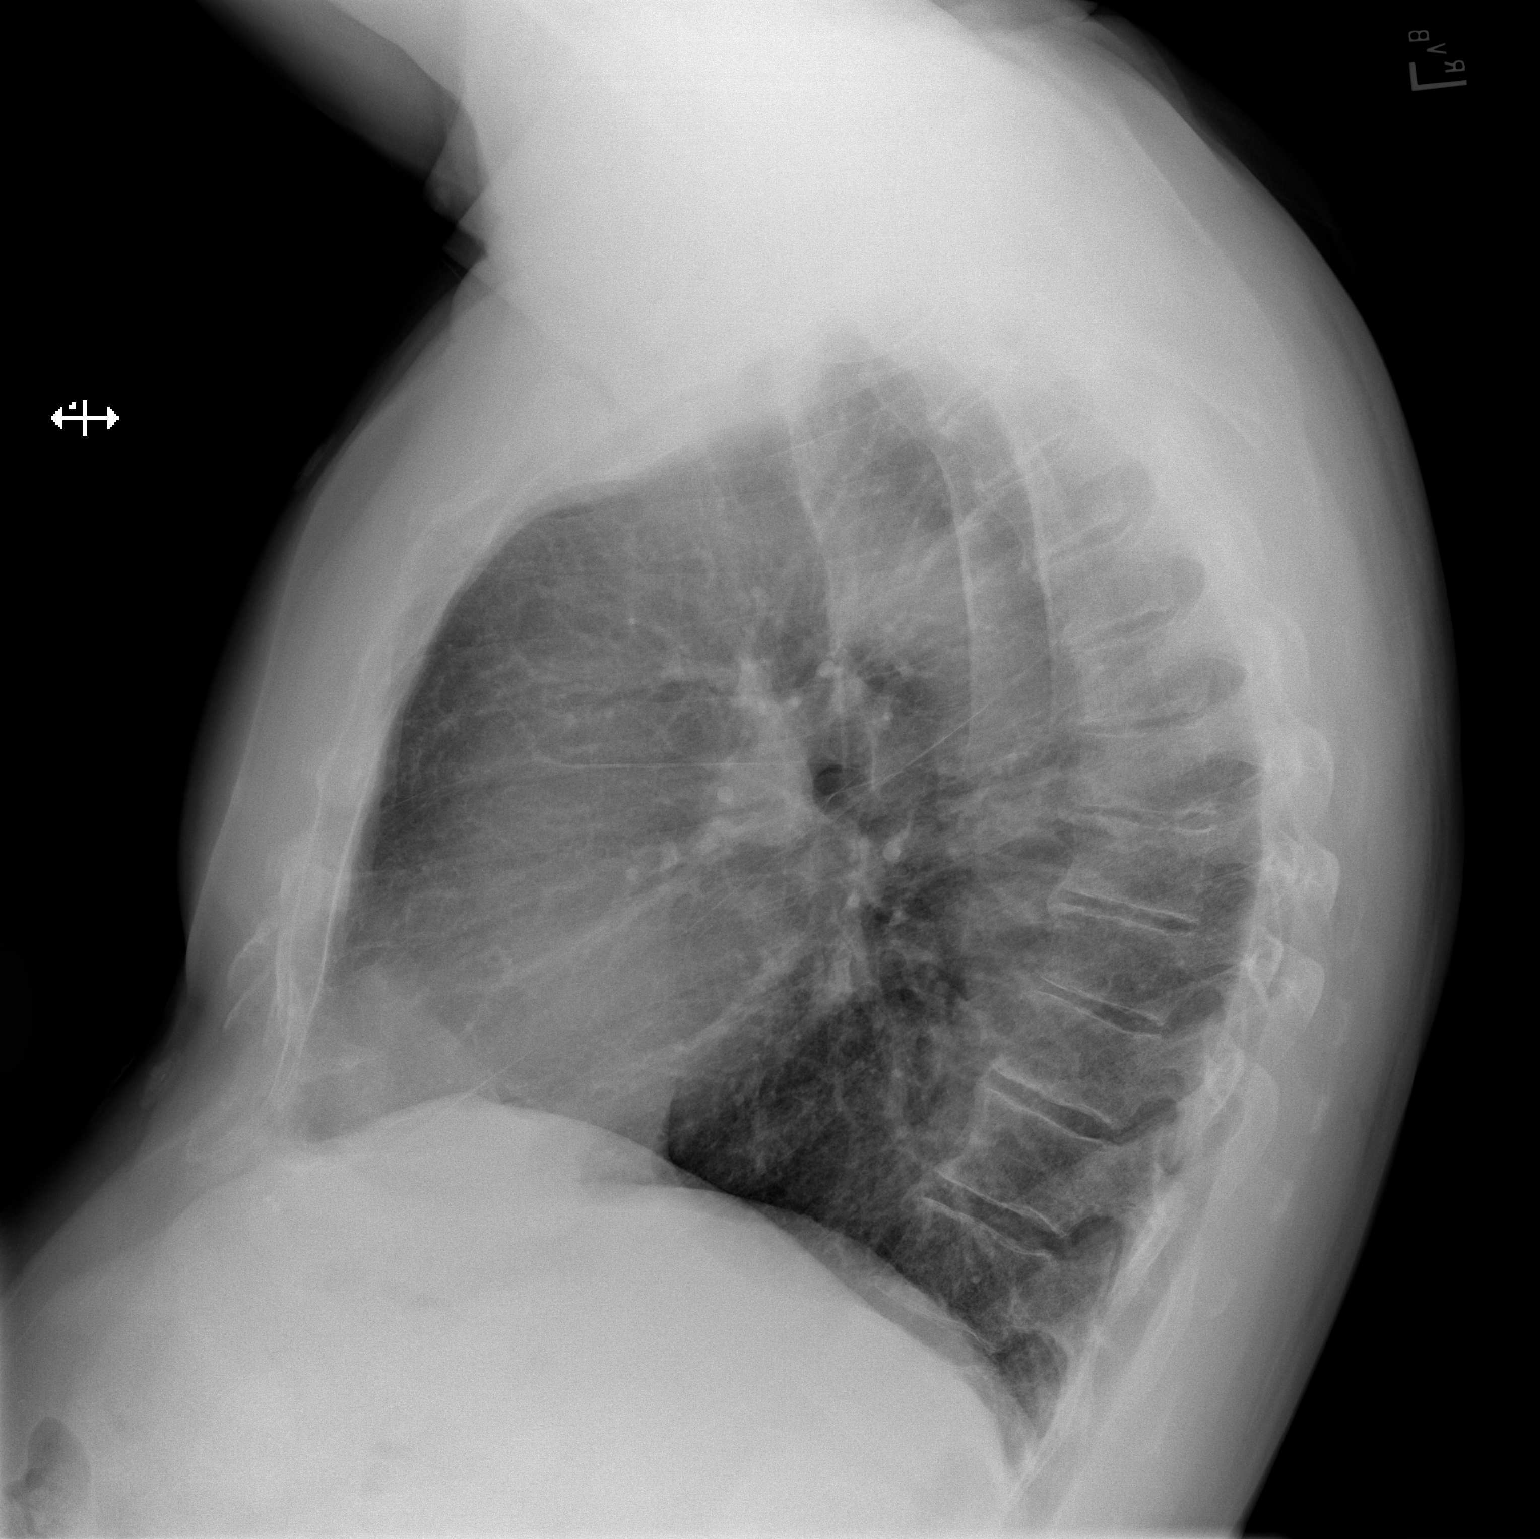

[2 of 2 positions shown; findings below may reference images not displayed]

FINDINGS: Cardiac size is within normal limits. Thoracic aorta is tortuous and
ectatic. Increase in AP diameter of chest suggests COPD. There are
no signs of pulmonary edema or focal pulmonary consolidation. There
is no pleural effusion or pneumothorax.
IMPRESSION: There are no signs of pulmonary edema or new focal infiltrates.

## 2022-05-28 ENCOUNTER — Telehealth: Payer: Self-pay

## 2022-05-28 NOTE — Telephone Encounter (Signed)
Pt Is aware of rescheduled date/time of procedure.   Pt was moved due to equipment being down in lab.

## 2022-06-02 NOTE — Pre-Procedure Instructions (Signed)
Attempted to call patient regarding procedure instructions.  Left voicemail on the following items: Arrival time 0930 Nothing to eat or drink after midnight No meds AM of procedure Responsible person to drive you home and stay with you for 24 hrs  Have you missed any doses of anti-coagulant Eliquis- if you have missed any doses please let the office know.

## 2022-06-03 ENCOUNTER — Encounter (HOSPITAL_COMMUNITY)
Admission: RE | Disposition: A | Discharge status: Home / Self Care | Payer: Self-pay | Source: Home / Self Care | Attending: Cardiology

## 2022-06-03 ENCOUNTER — Encounter (HOSPITAL_COMMUNITY): Payer: Self-pay | Admitting: Cardiology

## 2022-06-03 ENCOUNTER — Other Ambulatory Visit: Payer: Self-pay

## 2022-06-03 ENCOUNTER — Ambulatory Visit (HOSPITAL_BASED_OUTPATIENT_CLINIC_OR_DEPARTMENT_OTHER): Payer: Medicare Other | Admitting: Certified Registered Nurse Anesthetist

## 2022-06-03 ENCOUNTER — Ambulatory Visit (HOSPITAL_COMMUNITY)
Admission: RE | Admit: 2022-06-03 | Discharge: 2022-06-03 | Disposition: A | Discharge status: Home / Self Care | Payer: Medicare Other | Attending: Cardiology | Admitting: Cardiology

## 2022-06-03 ENCOUNTER — Ambulatory Visit (HOSPITAL_COMMUNITY): Payer: Medicare Other | Admitting: Certified Registered Nurse Anesthetist

## 2022-06-03 ENCOUNTER — Other Ambulatory Visit (HOSPITAL_COMMUNITY): Payer: Self-pay

## 2022-06-03 DIAGNOSIS — Z7901 Long term (current) use of anticoagulants: Secondary | ICD-10-CM | POA: Insufficient documentation

## 2022-06-03 DIAGNOSIS — I77819 Aortic ectasia, unspecified site: Secondary | ICD-10-CM | POA: Insufficient documentation

## 2022-06-03 DIAGNOSIS — I4891 Unspecified atrial fibrillation: Secondary | ICD-10-CM | POA: Diagnosis not present

## 2022-06-03 DIAGNOSIS — I4819 Other persistent atrial fibrillation: Secondary | ICD-10-CM | POA: Diagnosis not present

## 2022-06-03 HISTORY — PX: ATRIAL FIBRILLATION ABLATION: EP1191

## 2022-06-03 SURGERY — ATRIAL FIBRILLATION ABLATION
Anesthesia: General

## 2022-06-03 MED ORDER — LIDOCAINE 2% (20 MG/ML) 5 ML SYRINGE
INTRAMUSCULAR | Status: DC | PRN
  Administered 2022-06-03: 100 mg via INTRAVENOUS

## 2022-06-03 MED ORDER — HEPARIN SODIUM (PORCINE) 1000 UNIT/ML IJ SOLN
INTRAMUSCULAR | Status: DC | PRN
  Administered 2022-06-03: 14000 [IU] via INTRAVENOUS
  Administered 2022-06-03: 5000 [IU] via INTRAVENOUS

## 2022-06-03 MED ORDER — HEPARIN SODIUM (PORCINE) 1000 UNIT/ML IJ SOLN
INTRAMUSCULAR | Status: AC
  Filled 2022-06-03: qty 10

## 2022-06-03 MED ORDER — SODIUM CHLORIDE 0.9% FLUSH: 3.0000 mL | Freq: Two times a day (BID) | INTRAVENOUS | Status: DC

## 2022-06-03 MED ORDER — LACTATED RINGERS IV SOLN: INTRAVENOUS | Status: DC | PRN

## 2022-06-03 MED ORDER — SODIUM CHLORIDE 0.9 % IV SOLN: INTRAVENOUS | Status: DC

## 2022-06-03 MED ORDER — COLCHICINE 0.6 MG PO TABS
0.6000 mg | ORAL_TABLET | Freq: Two times a day (BID) | ORAL | 0 refills | Status: DC
  Filled 2022-06-03: qty 10, 5d supply, fill #0

## 2022-06-03 MED ORDER — HEPARIN SODIUM (PORCINE) 1000 UNIT/ML IJ SOLN
INTRAMUSCULAR | Status: DC | PRN
  Administered 2022-06-03: 1000 [IU] via INTRAVENOUS

## 2022-06-03 MED ORDER — APIXABAN 5 MG PO TABS
5.0000 mg | ORAL_TABLET | Freq: Two times a day (BID) | ORAL | Status: DC
  Administered 2022-06-03: 5 mg via ORAL
  Filled 2022-06-03: qty 1

## 2022-06-03 MED ORDER — ONDANSETRON HCL 4 MG/2ML IJ SOLN
INTRAMUSCULAR | Status: DC | PRN
  Administered 2022-06-03: 4 mg via INTRAVENOUS

## 2022-06-03 MED ORDER — HEPARIN (PORCINE) IN NACL 1000-0.9 UT/500ML-% IV SOLN
INTRAVENOUS | Status: DC | PRN
  Administered 2022-06-03 (×3): 500 mL

## 2022-06-03 MED ORDER — ACETAMINOPHEN 325 MG PO TABS: 650.0000 mg | ORAL_TABLET | ORAL | Status: DC | PRN

## 2022-06-03 MED ORDER — PROTAMINE SULFATE 10 MG/ML IV SOLN
INTRAVENOUS | Status: DC | PRN
  Administered 2022-06-03: 35 mg via INTRAVENOUS

## 2022-06-03 MED ORDER — PANTOPRAZOLE SODIUM 40 MG PO TBEC
40.0000 mg | DELAYED_RELEASE_TABLET | Freq: Every day | ORAL | 0 refills | Status: DC
  Filled 2022-06-03: qty 45, 45d supply, fill #0

## 2022-06-03 MED ORDER — COLCHICINE 0.6 MG PO TABS
0.6000 mg | ORAL_TABLET | Freq: Two times a day (BID) | ORAL | Status: DC
  Administered 2022-06-03: 0.6 mg via ORAL
  Filled 2022-06-03: qty 1

## 2022-06-03 MED ORDER — ONDANSETRON HCL 4 MG/2ML IJ SOLN: 4.0000 mg | Freq: Four times a day (QID) | INTRAMUSCULAR | Status: DC | PRN

## 2022-06-03 MED ORDER — SUGAMMADEX SODIUM 200 MG/2ML IV SOLN
INTRAVENOUS | Status: DC | PRN
  Administered 2022-06-03: 200 mg via INTRAVENOUS

## 2022-06-03 MED ORDER — PANTOPRAZOLE SODIUM 40 MG PO TBEC
40.0000 mg | DELAYED_RELEASE_TABLET | Freq: Every day | ORAL | Status: DC
  Administered 2022-06-03: 40 mg via ORAL
  Filled 2022-06-03: qty 1

## 2022-06-03 MED ORDER — ROCURONIUM BROMIDE 10 MG/ML (PF) SYRINGE
PREFILLED_SYRINGE | INTRAVENOUS | Status: DC | PRN
  Administered 2022-06-03: 70 mg via INTRAVENOUS

## 2022-06-03 MED ORDER — PROPOFOL 10 MG/ML IV BOLUS
INTRAVENOUS | Status: DC | PRN
  Administered 2022-06-03: 20 mg via INTRAVENOUS
  Administered 2022-06-03: 180 mg via INTRAVENOUS

## 2022-06-03 MED ORDER — PHENYLEPHRINE HCL-NACL 20-0.9 MG/250ML-% IV SOLN
INTRAVENOUS | Status: DC | PRN
  Administered 2022-06-03: 25 ug/min via INTRAVENOUS

## 2022-06-03 MED ORDER — SODIUM CHLORIDE 0.9 % IV SOLN: 250.0000 mL | INTRAVENOUS | Status: DC | PRN

## 2022-06-03 MED ORDER — FENTANYL CITRATE (PF) 100 MCG/2ML IJ SOLN
INTRAMUSCULAR | Status: DC | PRN
  Administered 2022-06-03: 100 ug via INTRAVENOUS

## 2022-06-03 MED ORDER — SODIUM CHLORIDE 0.9% FLUSH: 3.0000 mL | INTRAVENOUS | Status: DC | PRN

## 2022-06-03 MED ORDER — DEXAMETHASONE SODIUM PHOSPHATE 10 MG/ML IJ SOLN
INTRAMUSCULAR | Status: DC | PRN
  Administered 2022-06-03: 10 mg via INTRAVENOUS

## 2022-06-03 SURGICAL SUPPLY — 19 items
BLANKET WARM UNDERBOD FULL ACC (MISCELLANEOUS) ×2 IMPLANT
CATH 8FR REPROCESSED SOUNDSTAR (CATHETERS) ×1 IMPLANT
CATH 8FR SOUNDSTAR REPROCESSED (CATHETERS) IMPLANT
CATH ABLAT QDOT MICRO BI TC FJ (CATHETERS) IMPLANT
CATH OCTARAY 2.0 F 3-3-3-3-3 (CATHETERS) IMPLANT
CATH S-M CIRCA TEMP PROBE (CATHETERS) IMPLANT
CATH WEB BI DIR CSDF CRV REPRO (CATHETERS) IMPLANT
CLOSURE PERCLOSE PROSTYLE (VASCULAR PRODUCTS) IMPLANT
COVER SWIFTLINK CONNECTOR (BAG) ×2 IMPLANT
PACK EP LATEX FREE (CUSTOM PROCEDURE TRAY) ×1
PACK EP LF (CUSTOM PROCEDURE TRAY) ×2 IMPLANT
PAD DEFIB RADIO PHYSIO CONN (PAD) ×2 IMPLANT
PATCH CARTO3 (PAD) IMPLANT
SHEATH BAYLIS TRANSSEPTAL 98CM (NEEDLE) IMPLANT
SHEATH CARTO VIZIGO SM CVD (SHEATH) IMPLANT
SHEATH PINNACLE 8F 10CM (SHEATH) IMPLANT
SHEATH PINNACLE 9F 10CM (SHEATH) IMPLANT
SHEATH PROBE COVER 6X72 (BAG) IMPLANT
TUBING SMART ABLATE COOLFLOW (TUBING) IMPLANT

## 2022-06-03 NOTE — Anesthesia Preprocedure Evaluation (Signed)
Anesthesia Evaluation  Patient identified by MRN, date of birth, ID band Patient awake    Reviewed: Allergy & Precautions, H&P , NPO status , Patient's Chart, lab work & pertinent test results  Airway Mallampati: II  TM Distance: >3 FB Neck ROM: Full    Dental no notable dental hx.    Pulmonary neg pulmonary ROS   Pulmonary exam normal breath sounds clear to auscultation       Cardiovascular + dysrhythmias Atrial Fibrillation  Rhythm:Irregular Rate:Normal     Neuro/Psych negative neurological ROS  negative psych ROS   GI/Hepatic negative GI ROS, Neg liver ROS,,,  Endo/Other  negative endocrine ROS    Renal/GU negative Renal ROS  negative genitourinary   Musculoskeletal negative musculoskeletal ROS (+)    Abdominal   Peds negative pediatric ROS (+)  Hematology negative hematology ROS (+)   Anesthesia Other Findings   Reproductive/Obstetrics negative OB ROS                             Anesthesia Physical Anesthesia Plan  ASA: 3  Anesthesia Plan: General   Post-op Pain Management: Minimal or no pain anticipated   Induction: Intravenous  PONV Risk Score and Plan: 2 and Ondansetron, Dexamethasone and Treatment may vary due to age or medical condition  Airway Management Planned: Oral ETT  Additional Equipment:   Intra-op Plan:   Post-operative Plan: Extubation in OR  Informed Consent: I have reviewed the patients History and Physical, chart, labs and discussed the procedure including the risks, benefits and alternatives for the proposed anesthesia with the patient or authorized representative who has indicated his/her understanding and acceptance.     Dental advisory given  Plan Discussed with: CRNA and Surgeon  Anesthesia Plan Comments:        Anesthesia Quick Evaluation

## 2022-06-03 NOTE — Anesthesia Postprocedure Evaluation (Signed)
Anesthesia Post Note  Patient: Corey Suarez  Procedure(s) Performed: ATRIAL FIBRILLATION ABLATION     Patient location during evaluation: PACU Anesthesia Type: General Level of consciousness: awake and alert Pain management: pain level controlled Vital Signs Assessment: post-procedure vital signs reviewed and stable Respiratory status: spontaneous breathing, nonlabored ventilation, respiratory function stable and patient connected to nasal cannula oxygen Cardiovascular status: blood pressure returned to baseline and stable Postop Assessment: no apparent nausea or vomiting Anesthetic complications: no  There were no known notable events for this encounter.  Last Vitals:  Vitals:   06/03/22 1325 06/03/22 1330  BP: (!) 131/96 127/72  Pulse: 68 62  Resp: 16 16  Temp:    SpO2: 99% 99%    Last Pain:  Vitals:   06/03/22 1314  TempSrc: Temporal  PainSc: 0-No pain                 Zabdiel Dripps S

## 2022-06-03 NOTE — Discharge Instructions (Addendum)
Post procedure care instructions No driving for 4 days. No lifting over 5 lbs for 1 week. No vigorous or sexual activity for 1 week. You may return to work/your usual activities on 06/11/22. Keep procedure site clean & dry. If you notice increased pain, swelling, bleeding or pus, call/return!  You may shower after 24 hours, but no soaking in baths/hot tubs/pools for 1 week.    You have an appointment set up with the [facility].  Multiple studies have shown that being followed by a dedicated atrial fibrillation clinic in addition to the standard care you receive from your other physicians improves health. We believe that enrollment in the atrial fibrillation clinic will allow [place] to better care for you.   The phone number to the [facility] is [phone]. The clinic is staffed Monday through Friday from 8:30am to 5pm.  Directions: The clinic is located in the [facility], [place] the hospital at the MAIN ENTRANCE "A", use [address] to the 6th floor.  Registration desk to the right of elevators on 6th floor Cardiac Ablation, Care After  This sheet gives you information about how to care for yourself after your procedure. Your health care provider may also give you more specific instructions. If you have problems or questions, contact your health care provider. What can I expect after the procedure? After the procedure, it is common to have: Bruising around your puncture site. Tenderness around your puncture site. Skipped heartbeats. If you had an atrial fibrillation ablation, you may have atrial fibrillation during the first several months after your procedure.  Tiredness (fatigue).  Follow these instructions at home: Puncture site care  Follow instructions from your health care provider about how to take care of your puncture site. Make sure you: If present, leave stitches (sutures), skin glue, or adhesive strips in place. These skin  closures may need to stay in place for up to 2 weeks. If adhesive strip edges start to loosen and curl up, you may trim the loose edges. Do not remove adhesive strips completely unless your health care provider tells you to do that. If a large square bandage is present, this may be removed 24 hours after surgery.  Check your puncture site every day for signs of infection. Check for: Redness, swelling, or pain. Fluid or blood. If your puncture site starts to bleed, lie down on your back, apply firm pressure to the area, and contact your health care provider. Warmth. Pus or a bad smell. A pea or small marble sized lump at the site is normal and can take up to three months to resolve.  Driving Do not drive for at least 4 days after your procedure or however long your health care provider recommends. (Do not resume driving if you have previously been instructed not to drive for other health reasons.) Do not drive or use heavy machinery while taking prescription pain medicine. Activity Avoid activities that take a lot of effort for at least 7 days after your procedure. Do not lift anything that is heavier than 5 lb (4.5 kg) for one week.  No sexual activity for 1 week.  Return to your normal activities as told by your health care provider. Ask your health care provider what activities are safe for you. General instructions Take over-the-counter and prescription medicines only as told by your health care provider. Do not use any products that contain nicotine or tobacco, such as cigarettes and e-cigarettes. If you need help  quitting, ask your health care provider. You may shower after 24 hours, but Do not take baths, swim, or use a hot tub for 1 week.  Do not drink alcohol for 24 hours after your procedure. Keep all follow-up visits as told by your health care provider. This is important. Contact a health care provider if: You have redness, mild swelling, or pain around your puncture site. You have  fluid or blood coming from your puncture site that stops after applying firm pressure to the area. Your puncture site feels warm to the touch. You have pus or a bad smell coming from your puncture site. You have a fever. You have chest pain or discomfort that spreads to your neck, jaw, or arm. You have chest pain that is worse with lying on your back or taking a deep breath. You are sweating a lot. You feel nauseous. You have a fast or irregular heartbeat. You have shortness of breath. You are dizzy or light-headed and feel the need to lie down. You have pain or numbness in the arm or leg closest to your puncture site. Get help right away if: Your puncture site suddenly swells. Your puncture site is bleeding and the bleeding does not stop after applying firm pressure to the area. These symptoms may represent a serious problem that is an emergency. Do not wait to see if the symptoms will go away. Get medical help right away. Call your local emergency services (911 in the [place] Do not drive yourself to the hospital. Summary After the procedure, it is normal to have bruising and tenderness at the puncture site in your groin, neck, or forearm. Check your puncture site every day for signs of infection. Get help right away if your puncture site is bleeding and the bleeding does not stop after applying firm pressure to the area. This is a medical emergency. This information is not intended to replace advice given to you by your health care provider. Make sure you discuss any questions you have with your health care provider.   If you have any trouble locating the clinic, please don't hesitate to call [phone].

## 2022-06-03 NOTE — Anesthesia Procedure Notes (Signed)
Procedure Name: Intubation Date/Time: 06/03/2022 11:36 AM  Performed by: Dorthea Cove, CRNAPre-anesthesia Checklist: Patient identified, Emergency Drugs available, Suction available and Patient being monitored Patient Re-evaluated:Patient Re-evaluated prior to induction Oxygen Delivery Method: Circle system utilized Preoxygenation: Pre-oxygenation with 100% oxygen Induction Type: IV induction Ventilation: Mask ventilation without difficulty and Oral airway inserted - appropriate to patient size Laryngoscope Size: Mac and 4 Grade View: Grade I Tube type: Oral Tube size: 7.5 mm Number of attempts: 1 Airway Equipment and Method: Stylet and Oral airway Placement Confirmation: ETT inserted through vocal cords under direct vision, positive ETCO2 and breath sounds checked- equal and bilateral Secured at: 23 cm Tube secured with: Tape Dental Injury: Teeth and Oropharynx as per pre-operative assessment

## 2022-06-03 NOTE — Transfer of Care (Signed)
Immediate Anesthesia Transfer of Care Note  Patient: Corey Suarez  Procedure(s) Performed: ATRIAL FIBRILLATION ABLATION  Patient Location: Cath Lab  Anesthesia Type:General  Level of Consciousness: awake, alert , and oriented  Airway & Oxygen Therapy: Patient Spontanous Breathing  Post-op Assessment: Report given to RN and Post -op Vital signs reviewed and stable  Post vital signs: Reviewed and stable  Last Vitals:  Vitals Value Taken Time  BP 140/77 06/03/22 1314  Temp    Pulse 70 06/03/22 1317  Resp 16 06/03/22 1317  SpO2 99 % 06/03/22 1317  Vitals shown include unvalidated device data.  Last Pain:  Vitals:   06/03/22 1009  TempSrc:   PainSc: 0-No pain         Complications: There were no known notable events for this encounter.

## 2022-06-03 NOTE — H&P (Signed)
Electrophysiology Office Note:     Date:  06/03/2022    ID:  Corey Suarez, Corey Suarez [birth year], MRN [id number]   PCP:  Asencion Noble, MD        [facility] Cardiologist:  None  [facility] Electrophysiologist:  Vickie Epley, MD    Referring MD: Geralynn Rile, *    Chief Complaint: Persistent atrial fibrillation   History of Present Illness:     Corey Suarez is a 71 y.o. male who presents for an evaluation of persistent atrial fibrillation at the request of Dr. Audie Box. Their medical history includes persistent atrial fibrillation, ascending aortic dilation, possible COPD, aortic atherosclerosis.  The patient last saw Dr. Audie Box in clinic February 10, 2022.  The patient was seen in the emergency department July 27 for symptomatic atrial fibrillation and underwent cardioversion.  His atrial fibrillation started when he was playing with his grandchildren on that occasion.  He was first diagnosed with atrial fibrillation in December 2022 during a work physical.  He was referred to the ER but had converted back to sinus rhythm by the time he was evaluated in the emergency department.  He tried diltiazem at home but it did not help the symptoms so he went to the ER where he was ultimately cardioverted.  He also reported chest pressure and a coronary CTA was ordered.  He has had at least 4 episodes of atrial fibrillation according to Dr. Kathalene Frames note.  He has been on Eliquis for stroke prophylaxis.   Today, he is accompanied by a family member   Known triggers of his arrhythmic episodes include strenuous activity and becoming upset/angry. During those times he will notice his heart rate will be higher such as 118 bpm; later the same night his heart rate spikes to 150-161 bpm. Usually he is sleeping when it occurs. In the past he would have a nightmare that he was trapped in a tunnel or was drowning. He would wake up with night sweats. However, these dreams would occur only  sleeping at home, and not in his truck. He is unsure if they truly correlate with his arrhythmias.   He is compliant with his Eliquis; no bleeding issues.   He has successfully lost weight. Previously he was 221 lbs, currently 204-205 lbs at home. His goal weight is 190 lbs. Generally he follows a healthy diet and is working on increasing his exercise.   Prior testing for sleep apnea was negative.   He denies any chest pain, shortness of breath, or peripheral edema. No lightheadedness, headaches, syncope, orthopnea, or PND.   Presents for PVI today.     Objective      Past Medical History:  Diagnosis Date   Arrhythmia             Past Surgical History:  Procedure Laterality Date   COLONOSCOPY N/A 01/23/2021    Procedure: COLONOSCOPY;  Surgeon: Rogene Houston, MD;  [facility];  Service: Endoscopy;  Laterality: N/A;  930   EYE SURGERY       HERNIA REPAIR       POLYPECTOMY   01/23/2021    Procedure: POLYPECTOMY;  Surgeon: Rogene Houston, MD;  [facility];  Service: Endoscopy;;   TONSILLECTOMY          Current Medications: Active Medications      Current Meds  Medication Sig   ALPHAGAN P 0.1 % SOLN Place 1 drop into the left eye 2 (two) times  daily.   apixaban (ELIQUIS) 5 MG TABS tablet Take 1 tablet (5 mg total) by mouth 2 (two) times daily.   Cholecalciferol (VITAMIN D3) 125 MCG (5000 UT) TABS Take 5,000 Units by mouth daily.   diltiazem (CARDIZEM) 30 MG tablet Take 1 tablet every 4 hours AS NEEDED for heart rate >100 as long as blood pressure >100.   dronedarone (MULTAQ) 400 MG tablet Take 1 tablet (400 mg total) by mouth 2 (two) times daily with a meal.   latanoprost (XALATAN) 0.005 % ophthalmic solution Place 1 drop into both eyes at bedtime.   testosterone cypionate (DEPOTESTOSTERONE CYPIONATE) 200 MG/ML injection Inject 200 mg into the muscle every 28 (twenty-eight) days.   vitamin C (ASCORBIC ACID) 500 MG tablet Take 500 mg by mouth daily.    vitamin E 180 MG (400 UNITS) capsule Take 400 Units by mouth daily.   zinc gluconate 50 MG tablet Take 50 mg by mouth daily.        Allergies:   Patient has no known allergies.    Social History         Socioeconomic History   Marital status: Married      Spouse name: 3   Number of children: Not on file   Years of education: Not on file   Highest education level: Not on file  Occupational History   Occupation: Truck Geophysicist/field seismologist  Tobacco Use   Smoking status: Never   Smokeless tobacco: Never  Vaping Use   Vaping Use: Never used  Substance and Sexual Activity   Alcohol use: Not Currently      Alcohol/week: 1.0 standard drink of alcohol      Types: 1 Standard drinks or equivalent per week      Comment: 1 mixed drink every 59mhs 06/24/21   Drug use: Never   Sexual activity: Not on file  Other Topics Concern   Not on file  Social History Narrative   Not on file    Social Determinants of Health    Financial Resource Strain: Not on file  Food Insecurity: Not on file  Transportation Needs: Not on file  Physical Activity: Not on file  Stress: Not on file  Social Connections: Not on file      Family History: The patient's family history is not on file.   ROS:   Please see the history of present illness.    All other systems reviewed and are negative.   EKGs/Labs/Other Studies Reviewed:     The following studies were reviewed today:   March 03, 2022 coronary CTA Dilation of the ascending thoracic aorta.  No obstructive coronary artery disease.   August 04, 2021 echo EF 57% Mild LVH RV normal       EKG:   EKG is personally reviewed.  03/23/2022:  EKG was not ordered.     Recent Labs: 06/16/2021: ALT 36 02/05/2022: BUN 21; Creatinine, Ser 1.22; Hemoglobin 18.6; Platelets 287; Potassium 4.0; Sodium 138    Recent Lipid Panel Labs (Brief)  No results found for: "CHOL", "TRIG", "HDL", "CHOLHDL", "VLDL", "LDLCALC", "LDLDIRECT"     Physical Exam:     VS:  BP  150/96   Pulse 79   Ht '5\' 11"'$  (1.803 m)   Wt 207 lb 6.4 oz (94.1 kg)   SpO2 95%   BMI 28.93 kg/m         Wt Readings from Last 3 Encounters:  03/23/22 207 lb 6.4 oz (94.1 kg)  02/10/22 207 lb 6.4 oz (  94.1 kg)  02/05/22 201 lb (91.2 kg)      GEN: Well nourished, well developed in no acute distress HEENT: Normal NECK: No JVD; No carotid bruits LYMPHATICS: No lymphadenopathy CARDIAC: RRR, no murmurs, rubs, gallops RESPIRATORY:  Clear to auscultation without rales, wheezing or rhonchi  ABDOMEN: Soft, non-tender, non-distended MUSCULOSKELETAL:  No edema; No deformity  SKIN: Warm and dry NEUROLOGIC:  Alert and oriented x 3 PSYCHIATRIC:  Normal affect          Assessment ASSESSMENT:     1. Persistent atrial fibrillation ([place])   2. Dilation of aorta (HCC)   3. Pre-op evaluation     PLAN:     In order of problems listed above:   #Persistent atrial fibrillation On Eliquis for stroke prophylaxis. Takes diltiazem and dronedarone for rhythm control.   Discussed treatment options today for his AF including antiarrhythmic drug therapy and ablation. Discussed risks, recovery and likelihood of success. Discussed potential need for repeat ablation procedures and antiarrhythmic drugs after an initial ablation. They wish to proceed with scheduling.   Risk, benefits, and alternatives to EP study and radiofrequency ablation for afib were also discussed in detail today. These risks include but are not limited to stroke, bleeding, vascular damage, tamponade, perforation, damage to the esophagus, lungs, and other structures, pulmonary vein stenosis, worsening renal function, and death. The patient understands these risk and wishes to proceed.  We will therefore proceed with catheter ablation at the next available time.  Carto, ICE, anesthesia are requested for the procedure.  Will also obtain CT PV protocol prior to the procedure to exclude LAA thrombus and further evaluate atrial anatomy.     Presents for PVI today.  Signed, Hilton Cork. Quentin Ore, MD, [facility], [facility] 06/03/2022 Electrophysiology [facility]

## 2022-06-05 LAB — POCT ACTIVATED CLOTTING TIME
Activated Clotting Time: 287 seconds
Activated Clotting Time: 299 seconds

## 2022-06-08 ENCOUNTER — Encounter (HOSPITAL_COMMUNITY): Payer: Self-pay | Admitting: Cardiology

## 2022-06-12 ENCOUNTER — Other Ambulatory Visit: Payer: Self-pay | Admitting: Cardiovascular Disease

## 2022-07-01 ENCOUNTER — Ambulatory Visit (HOSPITAL_COMMUNITY)
Admission: RE | Admit: 2022-07-01 | Discharge: 2022-07-01 | Disposition: A | Discharge status: Home / Self Care | Payer: Medicare Other | Source: Ambulatory Visit | Attending: Physician Assistant | Admitting: Physician Assistant

## 2022-07-01 VITALS — BP 132/70 | HR 63 | Ht 71.0 in | Wt 203.8 lb

## 2022-07-01 DIAGNOSIS — Z7901 Long term (current) use of anticoagulants: Secondary | ICD-10-CM | POA: Diagnosis not present

## 2022-07-01 DIAGNOSIS — I480 Paroxysmal atrial fibrillation: Secondary | ICD-10-CM | POA: Insufficient documentation

## 2022-07-01 NOTE — Progress Notes (Signed)
Primary Care Physician: Asencion Noble, MD Primary Cardiologist: Dr Audie Box Primary Electrophysiologist: Dr Quentin Ore Referring Physician: [facility]   Corey Suarez is a 71 y.o. male with a history of atrial fibrillation who presents for follow up in the [facility].  The patient was initially diagnosed with atrial fibrillation 06/16/21 after presenting for a work physical. He was found to have rapid afib at that time (ECG in media tab shows afib HR 156). He had converted by the time he presented to the ED. Patient has a CHADS2VASC score of 1. He was asymptomatic. He denies any significant alcohol use or snoring. He had just received news that a close friend had passed away. He also had more alcohol than usual during the holidays. His episode lasted ~12 hours. He was not symptomatic but his smart watch alerted him.   Unfortunately, he continued to have episodes of afib and underwent DCCV in the ED 02/05/22. He was seen by Dr Quentin Ore and underwent afib ablation on 06/03/22.  On follow up today, patient reports that he has had 3 episodes of afib since the ablation, all lasting only ~ 1 minute. He denies chest pain, swallowing pain, or groin issues. He does have a mild "pressure" sensation on the front of his neck. He also reports some SOB with activity but admits he has not been able to be active for several weeks.   Today, he denies symptoms of palpitations, chest pain, orthopnea, PND, lower extremity edema, dizziness, presyncope, syncope, snoring, daytime somnolence, bleeding, or neurologic sequela. The patient is tolerating medications without difficulties and is otherwise without complaint today.    Atrial Fibrillation Risk Factors:  he does not have symptoms or diagnosis of sleep apnea. he does not have a history of rheumatic fever. he does not have a history of alcohol use. The patient does not have a history of early familial atrial fibrillation or other  arrhythmias.  he has a BMI of Body mass index is 28.42 kg/m.Marland Kitchen Filed Weights   07/01/22 1317  Weight: 92.4 kg    No family history on file.   Atrial Fibrillation Management history:  Previous antiarrhythmic drugs: Multaq Previous cardioversions: 02/05/22 Previous ablations: none CHADS2VASC score: 1 Anticoagulation history: Eliquis   Past Medical History:  Diagnosis Date   Arrhythmia    Past Surgical History:  Procedure Laterality Date   ATRIAL FIBRILLATION ABLATION N/A 06/03/2022   Procedure: ATRIAL FIBRILLATION ABLATION;  Surgeon: Vickie Epley, MD;  Location: [facility];  Service: Cardiovascular;  Laterality: N/A;   COLONOSCOPY N/A 01/23/2021   Procedure: COLONOSCOPY;  Surgeon: Rogene Houston, MD;  [facility];  Service: Endoscopy;  Laterality: N/A;  930   EYE SURGERY     HERNIA REPAIR     POLYPECTOMY  01/23/2021   Procedure: POLYPECTOMY;  Surgeon: Rogene Houston, MD;  [facility];  Service: Endoscopy;;   TONSILLECTOMY      Current Outpatient Medications  Medication Sig Dispense Refill   apixaban (ELIQUIS) 5 MG TABS tablet TAKE 1 TABLET(5 MG) BY MOUTH TWICE DAILY 60 tablet 5   brimonidine (ALPHAGAN) 0.2 % ophthalmic solution Place 1 drop into the left eye in the morning and at bedtime.     calcium carbonate (TUMS - DOSED IN MG ELEMENTAL CALCIUM) 500 MG chewable tablet Chew 1-2 tablets by mouth 3 (three) times daily as needed for indigestion or heartburn.     Calcium Carbonate Antacid (ANTACID SOFT CHEWS  PO) Take 1 tablet by mouth 2 (two) times daily as needed (indigestion/heartburn.).     Cholecalciferol (VITAMIN D3) 125 MCG (5000 UT) TABS Take 5,000 Units by mouth daily. (Patient not taking: Reported on 07/01/2022)     diltiazem (CARDIZEM) 30 MG tablet Take 1 tablet every 4 hours AS NEEDED for heart rate >100 as long as blood pressure >100. 30 tablet 1   ketorolac (ACULAR) 0.5 % ophthalmic solution Place 1 drop into the left eye 4  (four) times daily.     latanoprost (XALATAN) 0.005 % ophthalmic solution Place 1 drop into both eyes at bedtime.     MULTAQ 400 MG tablet TAKE 1 TABLET(400 MG) BY MOUTH TWICE DAILY WITH A MEAL 60 tablet 3   Multiple Vitamin (MULTIVITAMIN WITH MINERALS) TABS tablet Take 1 tablet by mouth every evening.     pantoprazole (PROTONIX) 40 MG tablet Take 1 tablet (40 mg total) by mouth daily. 45 tablet 0   prednisoLONE acetate (PRED FORTE) 1 % ophthalmic suspension Place 1 drop into the left eye in the morning and at bedtime.     testosterone cypionate (DEPOTESTOSTERONE CYPIONATE) 200 MG/ML injection Inject 200 mg into the muscle every 21 ( twenty-one) days.     vitamin C (ASCORBIC ACID) 500 MG tablet Take 500 mg by mouth daily.     vitamin E 180 MG (400 UNITS) capsule Take 400 Units by mouth daily.     zinc gluconate 50 MG tablet Take 50 mg by mouth daily.     No current facility-administered medications for this encounter.    No Known Allergies  Social History   Socioeconomic History   Marital status: Married    Spouse name: 3   Number of children: Not on file   Years of education: Not on file   Highest education level: Not on file  Occupational History   Occupation: Truck Geophysicist/field seismologist  Tobacco Use   Smoking status: Never   Smokeless tobacco: Never  Vaping Use   Vaping Use: Never used  Substance and Sexual Activity   Alcohol use: Not Currently    Alcohol/week: 1.0 standard drink of alcohol    Types: 1 Standard drinks or equivalent per week    Comment: 1 mixed drink every 25mhs 06/24/21   Drug use: Never   Sexual activity: Not on file  Other Topics Concern   Not on file  Social History Narrative   Not on file   Social Determinants of Health   Financial Resource Strain: Not on file  Food Insecurity: Not on file  Transportation Needs: Not on file  Physical Activity: Not on file  Stress: Not on file  Social Connections: Not on file  Intimate Partner Violence: Not on file      ROS- All systems are reviewed and negative except as per the HPI above.  Physical Exam: Vitals:   07/01/22 1317  Weight: 92.4 kg  Height: '5\' 11"'$  (1.803 m)    GEN- The patient is a well appearing male, alert and oriented x 3 today.   HEENT-head normocephalic, atraumatic, sclera clear, conjunctiva pink, hearing intact, trachea midline. Lungs- Clear to ausculation bilaterally, normal work of breathing Heart- Regular rate and rhythm, no murmurs, rubs or gallops  GI- soft, NT, ND, + BS Extremities- no clubbing, cyanosis, or edema MS- no significant deformity or atrophy Skin- no rash or lesion Psych- euthymic mood, full affect Neuro- strength and sensation are intact   Wt Readings from Last 3 Encounters:  07/01/22 92.4 kg  06/03/22 89.8 kg  03/23/22 94.1 kg    EKG today demonstrates  SR Vent. rate 63 BPM PR interval 170 ms QRS duration 92 ms QT/QTcB 416/425 ms  Echo 08/04/21 demonstrates 1. Left ventricular ejection fraction by 3D volume is 57 %. The left  ventricle has normal function. The left ventricle has no regional wall  motion abnormalities. There is mild left ventricular hypertrophy. Left  ventricular diastolic parameters are consistent with Grade I diastolic dysfunction (impaired relaxation). The average left ventricular global longitudinal strain is -18.8 %. The global longitudinal strain is normal.   2. Right ventricular systolic function is normal. The right ventricular  size is normal.   3. The mitral valve is normal in structure. No evidence of mitral valve  regurgitation. No evidence of mitral stenosis.   4. The aortic valve is normal in structure. Aortic valve regurgitation is  not visualized. No aortic stenosis is present.   5. Aortic dilatation noted. There is mild dilatation of the aortic root,  measuring 43 mm. There is mild dilatation of the ascending aorta,  measuring 42 mm.   6. The inferior vena cava is normal in size with greater than 50%   respiratory variability, suggesting right atrial pressure of 3 mmHg.    Epic records are reviewed at length today  CHA2DS2-VASc Score = 1  The patient's score is based upon: CHF History: 0 HTN History: 0 Diabetes History: 0 Stroke History: 0 Vascular Disease History: 0 Age Score: 1 Gender Score: 0        ASSESSMENT AND PLAN: 1. Paroxysmal Atrial Fibrillation (ICD10:  I48.0) The patient's CHA2DS2-VASc score is 1, indicating a 0.6% annual risk of stroke.   S/p afib ablation 06/03/22 Patient appears to be maintaining SR with only very brief episodes.  Continue Eliquis 5 mg BID with no missed doses for 3 months post ablation. Continue Multaq 400 mg BID for now.  Continue diltiazem 30 mg PRN q 4 hours for heart racing.  Apple Watch for home monitoring.   Follow up with Dr Quentin Ore as scheduled.    [facility] [address] [place], [place] [address] [phone] 07/01/2022 1:30 PM

## 2022-09-01 NOTE — Progress Notes (Unsigned)
  Electrophysiology Office Follow up Visit Note:    Date:  09/01/2022   ID:  Corey Suarez [birth year], MRN [id number]  PCP:  Asencion Noble, MD  [facility] Cardiologist:  None  [facility] Electrophysiologist:  Vickie Epley, MD    Interval History:    Corey Suarez is a 72 y.o. male who presents for a follow up visit.  He had an A-fib ablation June 03, 2022.  During the ablation the veins and posterior wall were isolated. He saw [name] 07/01/2022 and was maintaining sinus rhythm. He takes Multaq and Eliquis for stroke ppx.         Past medical, surgical, social and family history were reviewed.  Current Medications: No outpatient medications have been marked as taking for the 09/02/22 encounter (Appointment) with Vickie Epley, MD.     Allergies:   Patient has no known allergies.   ROS:   Please see the history of present illness.    All other systems reviewed and are negative.  EKGs/Labs/Other Studies Reviewed:    The following studies were reviewed today:    EKG:  The ekg ordered today demonstrates ***   Physical Exam:    VS:  There were no vitals taken for this visit.    Wt Readings from Last 3 Encounters:  07/01/22 203 lb 12.8 oz (92.4 kg)  06/03/22 198 lb (89.8 kg)  03/23/22 207 lb 6.4 oz (94.1 kg)     GEN: *** Well nourished, well developed in no acute distress CARDIAC: ***RRR, no murmurs, rubs, gallops RESPIRATORY:  Clear to auscultation without rales, wheezing or rhonchi       ASSESSMENT:    No diagnosis found. PLAN:    In order of problems listed above:  #Persistent AF  Continue eliquis, multaq    Follow up 1year with APP.       Medication Adjustments/Labs and Tests Ordered: Current medicines are reviewed at length with the patient today.  Concerns regarding medicines are outlined above.  No orders of the defined types were placed in this encounter.  No orders of the defined types were placed in  this encounter.    Signed, Lars Mage, MD, [facility], [facility] 09/01/2022 9:03 PM    Electrophysiology [facility]

## 2022-09-02 ENCOUNTER — Encounter: Payer: Self-pay | Admitting: Cardiology

## 2022-09-02 ENCOUNTER — Ambulatory Visit: Payer: Medicare Other | Attending: Cardiology | Admitting: Cardiology

## 2022-09-02 VITALS — BP 144/76 | HR 75 | Ht 71.0 in | Wt 200.8 lb

## 2022-09-02 DIAGNOSIS — I4819 Other persistent atrial fibrillation: Secondary | ICD-10-CM

## 2022-09-02 NOTE — Progress Notes (Signed)
  Electrophysiology Office Follow up Visit Note:    Date:  09/02/2022   ID:  Corey Suarez, DOB 06/23/1951, MRN [id number]  PCP:  Asencion Noble, MD  [facility] Cardiologist:  None  [facility] Electrophysiologist:  Vickie Epley, MD    Interval History:    Corey Suarez is a 72 y.o. male who presents for a follow up visit.  He had an A-fib ablation June 03, 2022.  During the ablation the veins and posterior wall were isolated. He saw [name] 07/01/2022 and was maintaining sinus rhythm. He takes Multaq and Eliquis for stroke ppx.   Today, he reports that his last a fib episode occurred 3-4 weeks ago. He complains that he feels that he has lost his strength recently. He reports that although he has gotten cuts on his skin occasionally, he does not bleed much. He regularly checks his BP at home. He reports a HR of 112 while outdoors.  He denies any chest pain, shortness of breath, or peripheral edema. No lightheadedness, headaches, syncope, orthopnea, or PND.  Past medical, surgical, social and family history were reviewed.   Current Medications: No outpatient medications have been marked as taking for the 09/02/22 encounter (Appointment) with Vickie Epley, MD.     Allergies:   Patient has no known allergies.   ROS:   Please see the history of present illness.     All other systems reviewed and are negative.  EKGs/Labs/Other Studies Reviewed:    The following studies were reviewed today:    EKG:  EKG is personally reviewed.  09/02/2022: Sinus rhythm.  PVC.   Physical Exam:    VS:  There were no vitals taken for this visit.    Wt Readings from Last 3 Encounters:  07/01/22 203 lb 12.8 oz (92.4 kg)  06/03/22 198 lb (89.8 kg)  03/23/22 207 lb 6.4 oz (94.1 kg)     GEN:  Well nourished, well developed in no acute distress CARDIAC: RRR, no murmurs, rubs, gallops RESPIRATORY:  Clear to auscultation without rales, wheezing or rhonchi        ASSESSMENT:    1. Persistent atrial fibrillation (HCC)    PLAN:    In order of problems listed above:  #Persistent AF #High risk med-dronedarone  Continue eliquis Stop Multaq.  Continue to monitor heart rhythm using Apple watch   Follow-up: 6 months with APP   Medication Adjustments/Labs and Tests Ordered: Current medicines are reviewed at length with the patient today.  Concerns regarding medicines are outlined above.  No orders of the defined types were placed in this encounter.  No orders of the defined types were placed in this encounter.   I,Corey Suarez,acting as a Education administrator for Vickie Epley, MD.,have documented all relevant documentation on the behalf of Vickie Epley, MD,as directed by  Vickie Epley, MD while in the presence of Vickie Epley, MD.  I, Vickie Epley, MD, have reviewed all documentation for this visit. The documentation on 09/02/22 for the exam, diagnosis, procedures, and orders are all accurate and complete.   Signed, Corey Mage, MD, [facility], [facility] 09/02/2022 12:30 PM    Electrophysiology [facility]

## 2022-09-02 NOTE — Patient Instructions (Signed)
Medication Instructions:  Your physician has recommended you make the following change in your medication:  1) STOP taking Multaq *If you need a refill on your cardiac medications before your next appointment, please call your pharmacy*  Follow-Up: At [facility], you and your health needs are our priority.  As part of our continuing mission to provide you with exceptional heart care, we have created designated Provider Care Teams.  These Care Teams include your primary Cardiologist (physician) and Advanced Practice Providers (APPs -  Physician Assistants and Nurse Practitioners) who all work together to provide you with the care you need, when you need it.  Your next appointment:   6 month(s)  Provider:   You will see one of the following Advanced Practice Providers on your designated Care Team:   Tommye Standard, [place]" [place], [place], NP

## 2022-12-11 ENCOUNTER — Other Ambulatory Visit: Payer: Self-pay | Admitting: Cardiovascular Disease

## 2022-12-11 NOTE — Telephone Encounter (Signed)
Prescription refill request for Eliquis received. Indication:afib Last office visit:2/24 Scr:1.39  11/23 Age: 72 Weight:91.1  kg  Prescription refilled

## 2023-03-05 ENCOUNTER — Encounter: Payer: Self-pay | Admitting: Urology

## 2023-03-05 ENCOUNTER — Ambulatory Visit: Payer: Medicare Other | Admitting: Urology

## 2023-03-05 VITALS — BP 124/70 | HR 101

## 2023-03-05 DIAGNOSIS — R972 Elevated prostate specific antigen [PSA]: Secondary | ICD-10-CM | POA: Diagnosis not present

## 2023-03-05 LAB — URINALYSIS, ROUTINE W REFLEX MICROSCOPIC
Bilirubin, UA: NEGATIVE
Glucose, UA: NEGATIVE
Ketones, UA: NEGATIVE
Leukocytes,UA: NEGATIVE
Nitrite, UA: NEGATIVE
Protein,UA: NEGATIVE
Specific Gravity, UA: 1.03 (ref 1.005–1.030)
Urobilinogen, Ur: 1 mg/dL (ref 0.2–1.0)
pH, UA: 5.5 (ref 5.0–7.5)

## 2023-03-05 LAB — MICROSCOPIC EXAMINATION
Bacteria, UA: NONE SEEN
WBC, UA: NONE SEEN /hpf (ref 0–5)

## 2023-03-05 LAB — BLADDER SCAN AMB NON-IMAGING: Scan Result: 23

## 2023-03-05 MED ORDER — LEVOFLOXACIN 750 MG PO TABS: 750.0000 mg | ORAL_TABLET | Freq: Once | ORAL | 0 refills | Status: AC

## 2023-03-05 NOTE — Progress Notes (Unsigned)
post void residual=23

## 2023-03-05 NOTE — Patient Instructions (Addendum)
Appointment Time: 12:30 please arrive by 12:15 Appointment Date: 03/31/23  Location: [facility]   Prostate Biopsy Instructions  Stop all aspirin or blood thinners (aspirin, plavix, coumadin, warfarin, motrin, ibuprofen, advil, aleve, naproxen, naprosyn) for 7 days prior to the procedure.  If you have any questions about stopping these medications, please contact your primary care physician or cardiologist.  Having a light meal prior to the procedure is recommended.  If you are diabetic or have low blood sugar please bring a small snack or glucose tablet.  A Fleets enema is needed to be purchased over the counter at a local pharmacy and used 2 hours before you scheduled appointment.  This can be purchased over the counter at any pharmacy.  Antibiotics will be administered in the clinic at the time of the procedure and 1 tablet has been sent to your pharmacy. Please take the antibiotic as prescribed.    Please bring someone with you to the procedure to drive you home if you are given a valium to take prior to your procedure.   If you have any questions or concerns, please feel free to call the office at [phone] or send a Mychart message.    Thank you, [facility]       Transrectal Ultrasound-Guided Prostate Biopsy A transrectal ultrasound-guided prostate biopsy is a procedure to remove samples of prostate tissue for testing. The prostate is a walnut-sized gland that is located below the bladder and in front of the rectum. During this procedure, a small device (probe) is lubricated and put inside the rectum. The probe sends out sound waves that make a picture of the prostate and surrounding tissues (transrectal ultrasound). The images are used to help guide the process of removing the samples. The samples are taken to a lab to be checked for prostate cancer. This procedure is usually done to evaluate the prostate gland of men who have raised  (elevated) levels of prostate-specific antigen (PSA), which can be a sign of prostate cancer or prostate enlargement related to aging (benign prostatic hyperplasia, or BPH). Tell a health care provider about: Any allergies you have. All medicines you are taking, including vitamins, herbs, eye drops, creams, and over-the-counter medicines. Any problems you or family members have had with anesthetic medicines. Any bleeding problems you have. Any surgeries you have had. Any medical conditions you have. Any prostate infections you have had. What are the risks? Generally, this is a safe procedure. However, problems may occur, including: Prostate infection. Bleeding from the rectum. Blood in the urine. Allergic reactions to medicines. Damage to surrounding structures such as blood vessels, organs, or muscles. Difficulty passing urine. Nerve damage. This is usually temporary. What happens before the procedure? Medicines Ask your health care provider about: Changing or stopping your regular medicines. This is especially important if you are taking diabetes medicines or blood thinners. Taking medicines such as aspirin and ibuprofen. These medicines can thin your blood. Do not take these medicines unless your health care provider tells you to take them. Taking over-the-counter medicines, vitamins, herbs, and supplements. General instructions Follow instructions from your health care provider about eating and drinking. In most instances, you will not need to stop eating and drinking completely before the procedure. You will be given an enema. During an enema, a liquid is injected into your rectum to clear out waste. You may have a blood or urine sample taken. Ask your health care provider what steps will be taken to help  prevent infection. These steps may include: Washing skin with a germ-killing soap. Taking antibiotic medicine. If you will be going home right after the procedure, plan to have a  responsible adult: Take you home from the hospital or clinic. You will not be allowed to drive. Care for you for the time you are told. What happens during the procedure?  An IV will be inserted into one of your veins. You will be given one or both of the following: A medicine to help you relax (sedative). A medicine to numb the area (local anesthetic). You will be placed on your left side, and your knees will be bent toward your chest. A probe with lubricated gel will be placed into your rectum, and images will be taken of your prostate and surrounding structures. Numbing medicine will be injected into your prostate. A biopsy needle will be inserted through your rectum or perineum and guided to your prostate using the ultrasound images. Prostate tissue samples will be removed, and the needle and probe will then be removed. The biopsy samples will be sent to a lab to be tested. The procedure may vary among health care providers and hospitals. What happens after the procedure? Your blood pressure, heart rate, breathing rate, and blood oxygen level will be monitored until you leave the hospital or clinic. You may have some discomfort in the rectal area. You will be given pain medicine as needed. If you were given a sedative during the procedure, it can affect you for several hours. Do not drive or operate machinery until your health care provider says that it is safe. It is up to you to get the results of your procedure. Ask your health care provider, or the department that is doing the procedure, when your results will be ready. Keep all follow-up visits. This is important. Summary A transrectal ultrasound-guided biopsy removes samples of tissue from your prostate using ultrasound-guided sound waves to help guide the process. This procedure is usually done to evaluate the prostate gland of men who have raised (elevated) levels of prostate-specific antigen (PSA), which can be a sign of prostate  cancer or prostate enlargement related to aging. After your procedure, you may feel some discomfort in the rectal area. Plan to have a responsible adult take you home from the hospital or clinic, and follow up with your health care provider for your results. This information is not intended to replace advice given to you by your health care provider. Make sure you discuss any questions you have with your health care provider. Document Revised: 12/23/2020 Document Reviewed: 12/23/2020 Elsevier Patient Education  2024 [employer].

## 2023-03-05 NOTE — Progress Notes (Unsigned)
03/05/2023 8:55 AM   Corey Suarez 1950/12/17 [id number]  Referring provider: Carylon Perches, MD [address] [place],  [place] [address]  Elevated PSA  HPI: Corey Suarez is a 72yo here for evaluation for elevated PSA. His psa was 4.1 last year and then in 10/2022 it was 7.1. IPSS 17 QOl 4 on no BPh therapy. Urine stream fair. Nocturia 0-2x. No family hx of prostate cancer. He has been on TRT for several years. UA normal.     PMH: Past Medical History:  Diagnosis Date   Arrhythmia     Surgical History: Past Surgical History:  Procedure Laterality Date   ATRIAL FIBRILLATION ABLATION N/A 06/03/2022   Procedure: ATRIAL FIBRILLATION ABLATION;  Surgeon: Lanier Prude, MD;  Location: [facility] INVASIVE CV LAB;  Service: Cardiovascular;  Laterality: N/A;   COLONOSCOPY N/A 01/23/2021   Procedure: COLONOSCOPY;  Surgeon: Malissa Hippo, MD;  Location: [facility] ENDO SUITE;  Service: Endoscopy;  Laterality: N/A;  930   EYE SURGERY     HERNIA REPAIR     POLYPECTOMY  01/23/2021   Procedure: POLYPECTOMY;  Surgeon: Malissa Hippo, MD;  Location: [facility] ENDO SU[facility];  Service: Endoscopy;;   TONSILLECTOMY      Home Medications:  Allergies as of 03/05/2023   No Known Allergies      Medication List        Accurate as of March 05, 2023  8:55 AM. If you have any questions, ask your nurse or doctor.          ascorbic acid 500 MG tablet Commonly known as: VITAMIN C Take 500 mg by mouth daily.   brimonidine 0.2 % ophthalmic solution Commonly known as: ALPHAGAN Place 1 drop into the left eye in the morning and at bedtime.   calcium carbonate 500 MG chewable tablet Commonly known as: TUMS - dosed in mg elemental calcium Chew 1-2 tablets by mouth 3 (three) times daily as needed for indigestion or heartburn.   ANTACID SOFT CHEWS PO Take 1 tablet by mouth 2 (two) times daily as needed (indigestion/heartburn.).   diltiazem 30 MG tablet Commonly known as: Cardizem Take 1 tablet  every 4 hours AS NEEDED for heart rate >100 as long as blood pressure >100.   Eliquis 5 MG Tabs tablet Generic drug: apixaban TAKE 1 TABLET(5 MG) BY MOUTH TWICE DAILY   latanoprost 0.005 % ophthalmic solution Commonly known as: XALATAN Place 1 drop into both eyes at bedtime.   multivitamin with minerals Tabs tablet Take 1 tablet by mouth every evening.   testosterone cypionate 200 MG/ML injection Commonly known as: DEPOTESTOSTERONE CYPIONATE Inject 200 mg into the muscle every 21 ( twenty-one) days.   Vitamin D3 125 MCG (5000 UT) Tabs Take 5,000 Units by mouth daily.   vitamin E 180 MG (400 UNITS) capsule Take 400 Units by mouth daily.   zinc gluconate 50 MG tablet Take 50 mg by mouth daily.        Allergies: No Known Allergies  Family History: No family history on file.  Social History:  reports that he has never smoked. He has never used smokeless tobacco. He reports that he does not currently use alcohol after a past usage of about 1.0 standard drink of alcohol per week. He reports that he does not use drugs.  ROS: All other review of systems were reviewed and are negative except what is noted above in HPI  Physical Exam: BP 124/70   Pulse (!) 101   Constitutional:  Alert and oriented, No acute distress. HEENT: [place] AT, moist mucus membranes.  Trachea midline, no masses. Cardiovascular: No clubbing, cyanosis, or edema. Respiratory: Normal respiratory effort, no increased work of breathing. GI: Abdomen is soft, nontender, nondistended, no abdominal masses GU: No CVA tenderness. Circumcised phallus. No masses/lesions on penis, testis, scrotum. Prostate 60g smooth no nodules no induration.  Lymph: No cervical or inguinal lymphadenopathy. Skin: No rashes, bruises or suspicious lesions. Neurologic: Grossly intact, no focal deficits, moving all 4 extremities. Psychiatric: Normal mood and affect.  Laboratory Data: Lab Results  Component Value Date   WBC 10.1  05/22/2022   HGB 16.6 05/22/2022   HCT 48.9 05/22/2022   MCV 87 05/22/2022   PLT 350 05/22/2022    Lab Results  Component Value Date   CREATININE 1.39 (H) 05/22/2022    No results found for: "PSA"  No results found for: "TESTOSTERONE"  No results found for: "HGBA1C"  Urinalysis No results found for: "COLORURINE", "APPEARANCEUR", "LABSPEC", "PHURINE", "GLUCOSEU", "HGBUR", "BILIRUBINUR", "KETONESUR", "PROTEINUR", "UROBILINOGEN", "NITRITE", "LEUKOCYTESUR"  No results found for: "LABMICR", "WBCUA", "RBCUA", "LABEPIT", "MUCUS", "BACTERIA"  Pertinent Imaging: *** No results found for this or any previous visit.  No results found for this or any previous visit.  No results found for this or any previous visit.  No results found for this or any previous visit.  No results found for this or any previous visit.  No valid procedures specified. No results found for this or any previous visit.  No results found for this or any previous visit.   Assessment & Plan:    1. Elevated PSA The patient and I talked about etiologies of elevated PSA.  We discussed the possible relationship between elevated PSA, prostate cancer, BPH, prostatitis, and UTI.   Conservative treatment of elevated PSA with watchful waiting was discussed with the patient.  All questions were answered.        All of the risks and benefits along with alternatives to prostate biopsy were discussed with the patient.  The patient gave fully informed consent to proceed with a transrectal ultrasound guided biopsy of the prostate for the evaluation of their evated PSA.  Prostate biopsy instructions and antibiotics were given to the patient.  - Urinalysis, Routine w reflex microscopic   No follow-ups on file.  Wilkie Aye, MD  [facility] [place]

## 2023-03-08 ENCOUNTER — Telehealth: Payer: Self-pay | Admitting: *Deleted

## 2023-03-08 NOTE — Telephone Encounter (Signed)
   Pre-operative Risk Assessment    Patient Name: Corey Suarez  DOB: [birth year] MRN: [id number]   DATE OF LAST VISIT:  09/02/22 DR. Lalla Brothers DATE OF NEXT VISIT: NONE SCHEDULED   Request for Surgical Clearance    Procedure:   PROSTATE Bx   Date of Surgery:  Clearance 03/31/23                                 Surgeon:  DR. PATRICK McKENZIE Surgeon's Group or Practice Name:  [facility] [place] Phone number:  [phone] Fax number:  [phone]   Type of Clearance Requested:   - Medical  - Pharmacy:  Hold Apixaban (Eliquis) x 2 DAYS PRIOR AND 2 DAYS POST OP   Type of Anesthesia:  Local    Additional requests/questions:    Elpidio Anis   03/08/2023, 2:40 PM  Message Received: Today Wittenborn, Gavin Pound, NP  P Cv Div Preop Callback Pre-op team,  Will you please create this request as a telephone encounter.  Thank you!  DW       Previous Messages  Letter  Malen Gauze, MD on 03/05/2023     [facility] [place] [address] [place], [place]  [address] Phone:  [phone]   Fax:  [phone]   March 05, 2023   Patient: Corey Suarez  MRN: [id number]  Date of Birth: [birth year]  Date of Visit: 03/05/2023    Dr. Jeanie Cooks,   The above stated patient is having prostate biopsy performed at [facility] on 03/31/2023. Dr. Janeece Agee will perform this procedure using local anesthesia . Dr. Janeece Agee  is asking for cardiology clearance and for approval for patient to hold Eliquis 2 days before the scheduled procedure and 2 days after Please send back supporting documentation of this approval request.   Thank you,   Hope, LPN Dr. Janeece Agee

## 2023-03-08 NOTE — Telephone Encounter (Signed)
-----   Message from Carlos Levering sent at 03/08/2023 10:44 AM EDT ----- Pre-op team,   Will you please create this request as a telephone encounter.   Thank you!  DW ----- Message ----- From: Lanier Prude, MD Sent: 03/07/2023  11:59 AM EDT To: Mickie Bail Preop   ----- Message ----- From: Gustavus Messing, LPN Sent: 02/04/3663  12:01 PM EDT To: Lanier Prude, MD

## 2023-03-08 NOTE — Telephone Encounter (Signed)
Please advise holding Eliquis prior to prostate biopsy on 9/18.  Thank you!  DW

## 2023-03-09 ENCOUNTER — Telehealth: Payer: Self-pay

## 2023-03-09 NOTE — Telephone Encounter (Signed)
   Name: Corey Suarez  DOB: 1950/12/30  MRN: [id number]  Primary Cardiologist: None   Preoperative team, please contact this patient and set up a phone call appointment for further preoperative risk assessment. Please obtain consent and complete medication review. Thank you for your help.  I confirm that guidance regarding antiplatelet and oral anticoagulation therapy has been completed and, if necessary, noted below.  Per Pharm D, patient may hold Eliquis for 2 days prior to procedure and resume 2 days after.    Carlos Levering, NP 03/09/2023, 8:56 AM [facility]

## 2023-03-09 NOTE — Addendum Note (Signed)
Addended by: Anselm Lis A on: 03/09/2023 12:15 PM   Modules accepted: Orders

## 2023-03-09 NOTE — Telephone Encounter (Signed)
Pt is scheduled 9/10 at 9am for tele preop. Med rec and consent done    Patient Consent for Virtual Visit        Corey Suarez has provided verbal consent on 03/09/2023 for a virtual visit (video or telephone).   CONSENT FOR VIRTUAL VISIT FOR:  Corey Suarez  By participating in this virtual visit I agree to the following:  I hereby voluntarily request, consent and authorize [facility] and its employed or contracted physicians, physician assistants, nurse practitioners or other licensed health care professionals (the Practitioner), to provide me with telemedicine health care services (the "Services") as deemed necessary by the treating Practitioner. I acknowledge and consent to receive the Services by the Practitioner via telemedicine. I understand that the telemedicine visit will involve communicating with the Practitioner through live audiovisual communication technology and the disclosure of certain medical information by electronic transmission. I acknowledge that I have been given the opportunity to request an in-person assessment or other available alternative prior to the telemedicine visit and am voluntarily participating in the telemedicine visit.  I understand that I have the right to withhold or withdraw my consent to the use of telemedicine in the course of my care at any time, without affecting my right to future care or treatment, and that the Practitioner or I may terminate the telemedicine visit at any time. I understand that I have the right to inspect all information obtained and/or recorded in the course of the telemedicine visit and may receive copies of available information for a reasonable fee.  I understand that some of the potential risks of receiving the Services via telemedicine include:  Delay or interruption in medical evaluation due to technological equipment failure or disruption; Information transmitted may not be sufficient (e.g. poor  resolution of images) to allow for appropriate medical decision making by the Practitioner; and/or  In rare instances, security protocols could fail, causing a breach of personal health information.  Furthermore, I acknowledge that it is my responsibility to provide information about my medical history, conditions and care that is complete and accurate to the best of my ability. I acknowledge that Practitioner's advice, recommendations, and/or decision may be based on factors not within their control, such as incomplete or inaccurate data provided by me or distortions of diagnostic images or specimens that may result from electronic transmissions. I understand that the practice of medicine is not an exact science and that Practitioner makes no warranties or guarantees regarding treatment outcomes. I acknowledge that a copy of this consent can be made available to me via my patient portal [facility] MyChart), or I can request a printed copy by calling the office of [facility].    I understand that my insurance will be billed for this visit.   I have read or had this consent read to me. I understand the contents of this consent, which adequately explains the benefits and risks of the Services being provided via telemedicine.  I have been provided ample opportunity to ask questions regarding this consent and the Services and have had my questions answered to my satisfaction. I give my informed consent for the services to be provided through the use of telemedicine in my medical care

## 2023-03-09 NOTE — Telephone Encounter (Signed)
Pt is scheduled 9/10 at 9am for tele preop. Med rec and consent done

## 2023-03-09 NOTE — Telephone Encounter (Signed)
Patient with diagnosis of afib on Eliquis for anticoagulation.    Procedure: prostate biopsy Date of procedure: 03/31/23  CHA2DS2-VASc Score = 1  This indicates a 0.6% annual risk of stroke. The patient's score is based upon: CHF History: 0 HTN History: 0 Diabetes History: 0 Stroke History: 0 Vascular Disease History: 0 Age Score: 1 Gender Score: 0     CrCl 12mL/min Platelet count 350K  Per office protocol, patient can hold Eliquis as requested for 2 days prior to procedure and resume 2 days after.    **This guidance is not considered finalized until pre-operative APP has relayed final recommendations.**

## 2023-03-10 NOTE — Telephone Encounter (Signed)
Spoke with the patient who reports that he has been having shortness of breath with exertion. He denies any chest pain. He reports when he climbs the stairs or loads his car he gets winded easily. He states that he doesn't have shortness of breath at rest, but will have trouble taking a deep breath at times. He denies any issues with his AFIB recently although he has noticed his resting heart rate to be in the 90s. It does not get above 100 often and he still does have diltiazem to use as needed. He did have a short episode of a dizzy spell recently. He is scheduled for an appointment to see Ernest,NP next week.

## 2023-03-15 NOTE — Progress Notes (Unsigned)
Cardiology Office Note    Patient Name: Corey Suarez Date of Encounter: 03/18/2023  Primary Care Provider:  Carylon Perches, MD Primary Cardiologist:  None Primary Electrophysiologist: Lanier Prude, MD   Past Medical History    Past Medical History:  Diagnosis Date   Arrhythmia     History of Present Illness  Corey Suarez is a 72 y.o. male with a PMH of PAF (on Eliquis), ascending aortic dilation (43 mm), aortic atherosclerosis who presents today for preoperative clearance.  Mr.Ballargeon was seen initially in 06/2021 for evaluation of AF that was discovered while having a work physical by EKG.  He presented to the ED and converted spontaneously.  He was seen by Dr. Flora Lipps on 10/08/2021 and was started on Eliquis and underwent a coronary CTA that showed no significant blockages with moderate dilation of the aorta.  And seen in follow-up by Dr. Lalla Brothers on 03/23/2022 to discuss treatment and patient underwent AF ablation on 05/2022.  He was seen last by Dr. Lalla Brothers on 09/02/2022 Multaq was discontinued no further changes made at that time.  During today's visit the patient reports they are doing well with no episodes of tachycardia or palpitations since his previous follow-up.  He does however endorse some shortness of breath with phlegm that accumulates in his throat and prevents him from lying flat.  He is euvolemic on exam and exhibits no signs of volume overload.  He is compliant with his current medications and denies any adverse reactions.  His blood pressure today is well-controlled at 138/80 and heart rate is 89 bpm.  He is scheduled to have a prostate biopsy and presents today for preoperative clearance.  Patient denies chest pain, palpitations, dyspnea, PND, orthopnea, nausea, vomiting, dizziness, syncope, edema, weight gain, or early satiety.   Review of Systems  Please see the history of present illness.    All other systems reviewed and are otherwise negative except  as noted above.  Physical Exam    Wt Readings from Last 3 Encounters:  03/18/23 184 lb 6.4 oz (83.6 kg)  09/02/22 200 lb 12.8 oz (91.1 kg)  07/01/22 203 lb 12.8 oz (92.4 kg)   VS: Vitals:   03/18/23 1359  BP: 138/80  Pulse: 89  SpO2: 96%  ,Body mass index is 25.72 kg/m. GEN: Well nourished, well developed in no acute distress Neck: No JVD; No carotid bruits Pulmonary: Clear to auscultation without rales, wheezing or rhonchi  Cardiovascular: Normal rate. Regular rhythm. Normal S1. Normal S2.   Murmurs: There is no murmur.  ABDOMEN: Soft, non-tender, non-distended EXTREMITIES:  No edema; No deformity   EKG/LABS/ Recent Cardiac Studies   ECG personally reviewed by me today -sinus rhythm with rate of 89 bpm and no acute changes consistent with previous EKG.  Risk Assessment/Calculations:    CHA2DS2-VASc Score = 1   This indicates a 0.6% annual risk of stroke. The patient's score is based upon: CHF History: 0 HTN History: 0 Diabetes History: 0 Stroke History: 0 Vascular Disease History: 0 Age Score: 1 Gender Score: 0          Lab Results  Component Value Date   WBC 10.1 05/22/2022   HGB 16.6 05/22/2022   HCT 48.9 05/22/2022   MCV 87 05/22/2022   PLT 350 05/22/2022   Lab Results  Component Value Date   CREATININE 1.39 (H) 05/22/2022   BUN 17 05/22/2022   NA 141 05/22/2022   K 4.5 05/22/2022   CL 104 05/22/2022  CO2 23 05/22/2022   No results found for: "CHOL", "HDL", "LDLCALC", "LDLDIRECT", "TRIG", "CHOLHDL"  No results found for: "HGBA1C" Assessment & Plan    1.  Preoperative clearance: -Patient's RCRI score is 0.9% -The patient affirms he has been doing well without any new cardiac symptoms. They are able to achieve  7 METS without cardiac limitations. Therefore, based on ACC/AHA guidelines, the patient would be at acceptable risk for the planned procedure without further cardiovascular testing. The patient was advised that if he develops new symptoms  prior to surgery to contact our office to arrange for a follow-up visit, and he verbalized understanding.   -Patient can hold Eliquis 2 days prior to procedure and 2 days postprocedure and should restart when surgically safe and hemostasis is achieved.  2.  Paroxysmal AF: -s/p AF Ablation 05/2022 discontinue Multaq and 08/2022. -Today patient is in sinus rhythm with a rate of 89. -Patient has not required as needed Cardizem since AF ablation -Patient's creatinine was 1.1 and hemoglobin was 16.7 -Continue Eliquis 5 mg twice daily -Low sodium diet, fluid restriction <2L, and daily weights encouraged. Educated to contact our office for weight gain of 2 lbs overnight or 5 lbs in one week.   3.  Aortic dilation: -Coronary CTA completed showing moderate dilation of aorta (42 mm) -Will need repeat CT of the chest in 1 year -Patient advised to avoid fluoroquinolone antibiotics -Patient was also advised to not lift anything over 50 to 75 pounds.  4.  Nonobstructive CAD: -Previous complaint of precordial chest pain and underwent coronary CTA that showed minimal noncalcified plaque in the LAD, RCA, mid circumflex -Today patient reports shortness of breath that occurs without activity and is associated with increased phlegm production. -He denies any chest pain or associated discomfort with his shortness of breath.  5.  Shortness of breath -Today patient reports ongoing shortness of breath that has occurred over the past 5 months and is not associated with any volume overload. -He denies any new medications of supplements since his previous follow-up and onset of condition. -Patient will complete PFTs   Disposition: Follow-up with None or APP in 6 months    Signed, Napoleon Form, Leodis Rains, NP 03/18/2023, 2:09 PM [facility]

## 2023-03-18 ENCOUNTER — Ambulatory Visit: Payer: Medicare Other | Attending: Nurse Practitioner | Admitting: Nurse Practitioner

## 2023-03-18 ENCOUNTER — Encounter: Payer: Self-pay | Admitting: Nurse Practitioner

## 2023-03-18 VITALS — BP 138/80 | HR 89 | Ht 71.0 in | Wt 184.4 lb

## 2023-03-18 DIAGNOSIS — Z01810 Encounter for preprocedural cardiovascular examination: Secondary | ICD-10-CM | POA: Diagnosis not present

## 2023-03-18 DIAGNOSIS — I77819 Aortic ectasia, unspecified site: Secondary | ICD-10-CM

## 2023-03-18 DIAGNOSIS — I4819 Other persistent atrial fibrillation: Secondary | ICD-10-CM

## 2023-03-18 DIAGNOSIS — I2510 Atherosclerotic heart disease of native coronary artery without angina pectoris: Secondary | ICD-10-CM | POA: Diagnosis not present

## 2023-03-18 NOTE — Patient Instructions (Addendum)
Medication Instructions:  HOLD ELIQUIS 2 DAYS BEFORE AND AFTER PROCEDURE AVOID ANTIBIOTICS WITH FLUOROQUINOLONES *If you need a refill on your cardiac medications before your next appointment, please call your pharmacy*   Lab Work: TODAY-BMET, BNP, CBC, TSH If you have labs (blood work) drawn today and your tests are completely normal, you will receive your results only by: MyChart Message (if you have MyChart) OR A paper copy in the mail If you have any lab test that is abnormal or we need to change your treatment, we will call you to review the results.   Testing/Procedures: REPEAT CHEST CT IN 1 YEAR Pulmonary Function Test   Follow-Up: At [facility], you and your health needs are our priority.  As part of our continuing mission to provide you with exceptional heart care, we have created designated Provider Care Teams.  These Care Teams include your primary Cardiologist (physician) and Advanced Practice Providers (APPs -  Physician Assistants and Nurse Practitioners) who all work together to provide you with the care you need, when you need it.  We recommend signing up for the patient portal called "MyChart".  Sign up information is provided on this After Visit Summary.  MyChart is used to connect with patients for Virtual Visits (Telemedicine).  Patients are able to view lab/test results, encounter notes, upcoming appointments, etc.  Non-urgent messages can be sent to your provider as well.   To learn more about what you can do with MyChart, go to [email or web address].    Your next appointment:   6 month(s)  Provider:   Robin Searing, NP   Other Instructions NO LIFTING OVER 50LBS

## 2023-03-19 LAB — CBC
Hematocrit: 50.9 % (ref 37.5–51.0)
Hemoglobin: 16.8 g/dL (ref 13.0–17.7)
MCH: 29.7 pg (ref 26.6–33.0)
MCHC: 33 g/dL (ref 31.5–35.7)
MCV: 90 fL (ref 79–97)
Platelets: 292 10*3/uL (ref 150–450)
RBC: 5.66 x10E6/uL (ref 4.14–5.80)
RDW: 13 % (ref 11.6–15.4)
WBC: 8.4 10*3/uL (ref 3.4–10.8)

## 2023-03-19 LAB — BASIC METABOLIC PANEL
BUN/Creatinine Ratio: 17 (ref 10–24)
BUN: 18 mg/dL (ref 8–27)
CO2: 24 mmol/L (ref 20–29)
Calcium: 9.2 mg/dL (ref 8.6–10.2)
Chloride: 102 mmol/L (ref 96–106)
Creatinine, Ser: 1.04 mg/dL (ref 0.76–1.27)
Glucose: 90 mg/dL (ref 70–99)
Potassium: 4.4 mmol/L (ref 3.5–5.2)
Sodium: 140 mmol/L (ref 134–144)
eGFR: 76 mL/min/{1.73_m2} (ref >59)

## 2023-03-19 LAB — PRO B NATRIURETIC PEPTIDE: NT-Pro BNP: <36 pg/mL (ref 0–376)

## 2023-03-19 LAB — TSH: TSH: 3.76 u[IU]/mL (ref 0.450–4.500)

## 2023-03-23 ENCOUNTER — Telehealth: Payer: Medicare Other

## 2023-03-25 ENCOUNTER — Encounter: Payer: Self-pay | Admitting: Cardiology

## 2023-03-25 ENCOUNTER — Telehealth: Payer: Self-pay | Admitting: Cardiology

## 2023-03-25 NOTE — Telephone Encounter (Signed)
I spoke with patient and his wife.  Symptoms noted below have been going on for several months.  I advised patient to see PCP.  Patient and his wife report patient has seen several providers and testing has been done.  No cause of symptoms has been determined.  Patient's wife reports they read side effects of Eliquis last night and several of his symptoms are listed as side effects. They are asking if Eliquis could be cause of symptoms

## 2023-03-25 NOTE — Telephone Encounter (Signed)
Spoke with the patient and his wife and advised on recommendations form PharmD. The patient is willing to try another blood thinner. Advised that I will send a message to Dr. Lalla Brothers to advise on and we will get back with the patient next week.

## 2023-03-25 NOTE — Telephone Encounter (Signed)
Pt c/o medication issue:  1. Name of Medication:   ELIQUIS 5 MG TABS tablet    2. How are you currently taking this medication (dosage and times per day)? TAKE 1 TABLET(5 MG) BY MOUTH TWICE DAILY   3. Are you having a reaction (difficulty breathing--STAT)? No   4. What is your medication issue? Pt states he is experiencing forgetfulness, his eyes are red, weakness, out of breathe and can't swallow. Please advise

## 2023-03-26 ENCOUNTER — Other Ambulatory Visit (HOSPITAL_COMMUNITY): Payer: Self-pay | Admitting: Internal Medicine

## 2023-03-26 DIAGNOSIS — R531 Weakness: Secondary | ICD-10-CM

## 2023-03-29 ENCOUNTER — Ambulatory Visit (HOSPITAL_COMMUNITY)
Admission: RE | Admit: 2023-03-29 | Discharge: 2023-03-29 | Disposition: A | Discharge status: Home / Self Care | Payer: Medicare Other | Source: Ambulatory Visit | Attending: Internal Medicine | Admitting: Internal Medicine

## 2023-03-29 DIAGNOSIS — R531 Weakness: Secondary | ICD-10-CM | POA: Diagnosis present

## 2023-03-30 ENCOUNTER — Telehealth: Payer: Self-pay | Admitting: Urology

## 2023-03-30 NOTE — Telephone Encounter (Signed)
Patient called back transferred to [facility]

## 2023-03-30 NOTE — Telephone Encounter (Signed)
Patient has question on how he is suppose to take medication before  his biopsy tomorrow?  He has questions about what to expect with the procedure.

## 2023-03-31 ENCOUNTER — Encounter: Payer: Self-pay | Admitting: Urology

## 2023-03-31 ENCOUNTER — Ambulatory Visit (HOSPITAL_BASED_OUTPATIENT_CLINIC_OR_DEPARTMENT_OTHER): Payer: Medicare Other | Admitting: Urology

## 2023-03-31 ENCOUNTER — Other Ambulatory Visit: Payer: Self-pay | Admitting: Urology

## 2023-03-31 ENCOUNTER — Ambulatory Visit (HOSPITAL_COMMUNITY)
Admission: RE | Admit: 2023-03-31 | Discharge: 2023-03-31 | Disposition: A | Discharge status: Home / Self Care | Payer: Medicare Other | Source: Ambulatory Visit | Attending: Urology | Admitting: Urology

## 2023-03-31 ENCOUNTER — Encounter (HOSPITAL_COMMUNITY): Payer: Self-pay

## 2023-03-31 VITALS — BP 131/94 | HR 104 | Temp 98.2°F | Resp 18

## 2023-03-31 DIAGNOSIS — C61 Malignant neoplasm of prostate: Secondary | ICD-10-CM | POA: Diagnosis not present

## 2023-03-31 DIAGNOSIS — R972 Elevated prostate specific antigen [PSA]: Secondary | ICD-10-CM | POA: Diagnosis not present

## 2023-03-31 MED ORDER — LIDOCAINE HCL (PF) 2 % IJ SOLN
10.0000 mL | Freq: Once | INTRAMUSCULAR | Status: AC
  Administered 2023-03-31: 10 mL

## 2023-03-31 MED ORDER — GENTAMICIN SULFATE 40 MG/ML IJ SOLN
80.0000 mg | Freq: Once | INTRAMUSCULAR | Status: AC
  Administered 2023-03-31: 80 mg via INTRAMUSCULAR

## 2023-03-31 MED ORDER — GENTAMICIN SULFATE 40 MG/ML IJ SOLN
INTRAMUSCULAR | Status: AC
  Filled 2023-03-31: qty 2

## 2023-03-31 MED ORDER — RIVAROXABAN 20 MG PO TABS: 20.0000 mg | ORAL_TABLET | Freq: Every day | ORAL | 11 refills | Status: DC

## 2023-03-31 MED ORDER — LIDOCAINE HCL (PF) 2 % IJ SOLN
INTRAMUSCULAR | Status: AC
  Filled 2023-03-31: qty 10

## 2023-03-31 NOTE — Patient Instructions (Signed)
Transrectal Ultrasound-Guided Prostate Biopsy, Care After What can I expect after the procedure? After the procedure, it is common to have: Pain and discomfort near your butt (rectum), especially while sitting. Pink-colored pee (urine). This is due to small amounts of blood in your pee. A burning feeling while peeing. Blood in your poop (stool). Bleeding from your butt. Blood in your semen. Follow these instructions at home: Medicines Take over-the-counter and prescription medicines only as told by your doctor. If you were given a sedative during your procedure, do not drive or use machines until your doctor says that it is safe. A sedative is a medicine that helps you relax. If you were prescribed an antibiotic medicine, take it as told by your doctor. Do not stop taking it even if you start to feel better. Activity  Return to your normal activities when your doctor says that it is safe. Ask your doctor when it is okay for you to have sex. You may have to avoid lifting. Ask your doctor how much you can safely lift. General instructions  Drink enough water to keep your pee pale yellow. Watch your pee, poop, and semen for new bleeding or bleeding that gets worse. Keep all follow-up visits. Contact a doctor if: You have any of these: Blood clots in your pee or poop. Blood in your pee more than 2 weeks after the procedure. Blood in your semen more than 2 months after the procedure. New or worse bleeding in your pee, poop, or semen. Very bad belly pain. Your pee smells bad or unusual. You have trouble peeing. Your lower belly feels firm. You have problems getting an erection. You feel like you may vomit (are nauseous), or you vomit. Get help right away if: You have a fever or chills. You have bright red pee. You have very bad pain that does not get better with medicine. You cannot pee. Summary After this procedure, it is common to have pain and discomfort near your butt,  especially while sitting. You may have blood in your pee and poop. It is common to have blood in your semen. Get help right away if you have a fever or chills. This information is not intended to replace advice given to you by your health care provider. Make sure you discuss any questions you have with your health care provider. Document Revised: 12/23/2020 Document Reviewed: 12/23/2020 Elsevier Patient Education  2024 [employer].

## 2023-03-31 NOTE — Progress Notes (Signed)
PT tolerated prostate biopsy procedure and antibiotic injection well today. Labs obtained and sent for pathology. PT ambulatory at discharge with no acute distress noted and verbalized understanding of discharge instructions.

## 2023-03-31 NOTE — Progress Notes (Signed)
Prostate Biopsy Procedure   Informed consent was obtained after discussing risks/benefits of the procedure.  A time out was performed to ensure correct patient identity.  Pre-Procedure: - Last PSA Level: No results found for: "PSA" - Gentamicin given prophylactically - Levaquin 500 mg administered PO -Transrectal Ultrasound performed revealing a 93.77 gm prostate -No significant hypoechoic or median lobe noted  Procedure: - Prostate block performed using 10 cc 1% lidocaine and biopsies taken from sextant areas, a total of 12 under ultrasound guidance.  Post-Procedure: - Patient tolerated the procedure well - He was counseled to seek immediate medical attention if experiences any severe pain, significant bleeding, or fevers - Return in one week to discuss biopsy results

## 2023-03-31 NOTE — Telephone Encounter (Signed)
Could try changing him to Xarelto 20mg  daily with supper (largest meal of the day).

## 2023-03-31 NOTE — Telephone Encounter (Signed)
Spoke with patient, agrees to switch from Eliquis to Xarelto 20 mg daily. No questions at this time.     Malena Peer D, RPH-CPP3 hours ago (1:50 PM)   Could try changing him to Xarelto 20mg  daily with supper (largest meal of the day).       Lanier Prude, MD to Frutoso Schatz, RN  03/29/23  6:09 PM I reviewed his recent testing. I do not think the Eliquis is causing his symptoms. He can change to Xarelto if he would like to see if this helps him feel better. Carly, can you offer to change this for him? Thanks, Sheria Lang T. Lalla Brothers, MD, [facility], [facility] Cardiac Electrophysiology

## 2023-04-02 ENCOUNTER — Ambulatory Visit (HOSPITAL_COMMUNITY)
Admission: RE | Admit: 2023-04-02 | Discharge: 2023-04-02 | Disposition: A | Discharge status: Home / Self Care | Payer: Medicare Other | Source: Ambulatory Visit | Attending: Internal Medicine | Admitting: Internal Medicine

## 2023-04-02 ENCOUNTER — Other Ambulatory Visit (HOSPITAL_COMMUNITY): Payer: Self-pay | Admitting: Internal Medicine

## 2023-04-02 ENCOUNTER — Other Ambulatory Visit: Payer: Self-pay | Admitting: Internal Medicine

## 2023-04-02 DIAGNOSIS — R0602 Shortness of breath: Secondary | ICD-10-CM | POA: Diagnosis present

## 2023-04-02 DIAGNOSIS — R634 Abnormal weight loss: Secondary | ICD-10-CM

## 2023-04-02 DIAGNOSIS — R100 Acute abdomen: Secondary | ICD-10-CM

## 2023-04-02 LAB — SURGICAL PATHOLOGY

## 2023-04-06 ENCOUNTER — Ambulatory Visit (HOSPITAL_COMMUNITY)
Admission: RE | Admit: 2023-04-06 | Discharge: 2023-04-06 | Disposition: A | Discharge status: Home / Self Care | Payer: Medicare Other | Source: Ambulatory Visit | Attending: Internal Medicine | Admitting: Internal Medicine

## 2023-04-06 DIAGNOSIS — R634 Abnormal weight loss: Secondary | ICD-10-CM | POA: Insufficient documentation

## 2023-04-06 DIAGNOSIS — R100 Acute abdomen: Secondary | ICD-10-CM | POA: Insufficient documentation

## 2023-04-06 MED ORDER — SODIUM CHLORIDE (PF) 0.9 % IJ SOLN
INTRAMUSCULAR | Status: AC
  Filled 2023-04-06: qty 50

## 2023-04-06 MED ORDER — IOHEXOL 300 MG/ML  SOLN
100.0000 mL | Freq: Once | INTRAMUSCULAR | Status: AC | PRN
  Administered 2023-04-06: 100 mL via INTRAVENOUS

## 2023-04-07 ENCOUNTER — Telehealth: Payer: Self-pay | Admitting: Urology

## 2023-04-07 NOTE — Telephone Encounter (Signed)
Patient is aware he is scheduled to have biopsy talk with Dr. Ronne Binning. Patient voiced understanding.

## 2023-04-07 NOTE — Telephone Encounter (Signed)
Patient called to get his results from his biopsy, they were released in Mychart and he doesn't know what they mean/

## 2023-04-23 ENCOUNTER — Encounter: Payer: Self-pay | Admitting: Urology

## 2023-04-23 ENCOUNTER — Ambulatory Visit: Payer: Medicare Other | Admitting: Urology

## 2023-04-23 VITALS — BP 132/69 | HR 92

## 2023-04-23 DIAGNOSIS — C61 Malignant neoplasm of prostate: Secondary | ICD-10-CM | POA: Diagnosis not present

## 2023-04-23 DIAGNOSIS — R972 Elevated prostate specific antigen [PSA]: Secondary | ICD-10-CM

## 2023-04-23 NOTE — Patient Instructions (Signed)

## 2023-04-23 NOTE — Progress Notes (Signed)
04/23/2023 1:32 PM   Corey Suarez 02-28-1951 [id number]  Referring provider: Carylon Perches, MD [address] [place],  [place] [address]  No chief complaint on file.   HPI: Mr Corey Suarez is a 72yo here for followup after prostate biopsy. Biopsy revealed Gleason 3+4=7 in 1/12 cores and Gleason 3+3=6 in 2/12 cores. Prostate volume 93cc. PSA 7.1   PMH: Past Medical History:  Diagnosis Date   Arrhythmia     Surgical History: Past Surgical History:  Procedure Laterality Date   ATRIAL FIBRILLATION ABLATION N/A 06/03/2022   Procedure: ATRIAL FIBRILLATION ABLATION;  Surgeon: Lanier Prude, MD;  Location: [facility] INVASIVE CV LAB;  Service: Cardiovascular;  Laterality: N/A;   COLONOSCOPY N/A 01/23/2021   Procedure: COLONOSCOPY;  Surgeon: Malissa Hippo, MD;  Location: [facility] ENDO SUITE;  Service: Endoscopy;  Laterality: N/A;  930   EYE SURGERY     HERNIA REPAIR     POLYPECTOMY  01/23/2021   Procedure: POLYPECTOMY;  Surgeon: Malissa Hippo, MD;  Location: [facility] ENDO SUITE;  Service: Endoscopy;;   TONSILLECTOMY      Home Medications:  Allergies as of 04/23/2023   No Known Allergies      Medication List        Accurate as of April 23, 2023  1:32 PM. If you have any questions, ask your nurse or doctor.          ascorbic acid 500 MG tablet Commonly known as: VITAMIN C Take 500 mg by mouth daily.   brimonidine 0.2 % ophthalmic solution Commonly known as: ALPHAGAN Place 1 drop into the left eye in the morning and at bedtime.   calcium carbonate 500 MG chewable tablet Commonly known as: TUMS - dosed in mg elemental calcium Chew 1-2 tablets by mouth 3 (three) times daily as needed for indigestion or heartburn.   ANTACID SOFT CHEWS PO Take 1 tablet by mouth 2 (two) times daily as needed (indigestion/heartburn.).   diltiazem 30 MG tablet Commonly known as: Cardizem Take 1 tablet every 4 hours AS NEEDED for heart rate >100 as long as blood pressure >100.    latanoprost 0.005 % ophthalmic solution Commonly known as: XALATAN Place 1 drop into both eyes at bedtime.   multivitamin with minerals Tabs tablet Take 1 tablet by mouth every evening.   omeprazole 40 MG capsule Commonly known as: PRILOSEC Take 40 mg by mouth every morning.   rivaroxaban 20 MG Tabs tablet Commonly known as: XARELTO Take 1 tablet (20 mg total) by mouth daily with supper.   testosterone cypionate 200 MG/ML injection Commonly known as: DEPOTESTOSTERONE CYPIONATE Inject 200 mg into the muscle every 21 ( twenty-one) days.   Vitamin D3 125 MCG (5000 UT) Tabs Take 5,000 Units by mouth daily.   vitamin E 180 MG (400 UNITS) capsule Take 400 Units by mouth daily.   zinc gluconate 50 MG tablet Take 50 mg by mouth daily.        Allergies: No Known Allergies  Family History: No family history on file.  Social History:  reports that he has never smoked. He has never used smokeless tobacco. He reports that he does not currently use alcohol after a past usage of about 1.0 standard drink of alcohol per week. He reports that he does not use drugs.  ROS: All other review of systems were reviewed and are negative except what is noted above in HPI  Physical Exam: BP 132/69   Pulse 92   Constitutional:  Alert  and oriented, No acute distress. HEENT: [place] AT, moist mucus membranes.  Trachea midline, no masses. Cardiovascular: No clubbing, cyanosis, or edema. Respiratory: Normal respiratory effort, no increased work of breathing. GI: Abdomen is soft, nontender, nondistended, no abdominal masses GU: No CVA tenderness.  Lymph: No cervical or inguinal lymphadenopathy. Skin: No rashes, bruises or suspicious lesions. Neurologic: Grossly intact, no focal deficits, moving all 4 extremities. Psychiatric: Normal mood and affect.  Laboratory Data: Lab Results  Component Value Date   WBC 8.4 03/18/2023   HGB 16.8 03/18/2023   HCT 50.9 03/18/2023   MCV 90 03/18/2023   PLT  292 03/18/2023    Lab Results  Component Value Date   CREATININE 1.04 03/18/2023    No results found for: "PSA"  No results found for: "TESTOSTERONE"  No results found for: "HGBA1C"  Urinalysis    Component Value Date/Time   APPEARANCEUR Clear 03/05/2023 0845   GLUCOSEU Negative 03/05/2023 0845   BILIRUBINUR Negative 03/05/2023 0845   PROTEINUR Negative 03/05/2023 0845   NITRITE Negative 03/05/2023 0845   LEUKOCYTESUR Negative 03/05/2023 0845    Lab Results  Component Value Date   LABMICR See below: 03/05/2023   WBCUA None seen 03/05/2023   LABEPIT 0-10 03/05/2023   BACTERIA None seen 03/05/2023    Pertinent Imaging:  No results found for this or any previous visit.  No results found for this or any previous visit.  No results found for this or any previous visit.  No results found for this or any previous visit.  No results found for this or any previous visit.  No valid procedures specified. No results found for this or any previous visit.  No results found for this or any previous visit.   Assessment & Plan:    1. Prostate cancer [facility]) I discussed the natural history of favorable intermediate risk prostate cancer with the patient and the various treatment options including active surveillance, RALP, IMRT, brachytherapy, cryotherapy, HIFU and ADT. After discussing the options the patient elects for RALP. I will refer him to Dr. Berneice Heinrich for consideration of RALP    No follow-ups on file.  Wilkie Aye, MD  [facility] [place]

## 2023-04-26 ENCOUNTER — Other Ambulatory Visit (HOSPITAL_COMMUNITY): Payer: Self-pay | Admitting: Internal Medicine

## 2023-04-26 DIAGNOSIS — R0602 Shortness of breath: Secondary | ICD-10-CM

## 2023-04-26 DIAGNOSIS — R634 Abnormal weight loss: Secondary | ICD-10-CM

## 2023-05-17 ENCOUNTER — Ambulatory Visit (HOSPITAL_COMMUNITY)
Admission: RE | Admit: 2023-05-17 | Discharge: 2023-05-17 | Disposition: A | Discharge status: Home / Self Care | Payer: Medicare Other | Source: Ambulatory Visit | Attending: Internal Medicine | Admitting: Internal Medicine

## 2023-05-17 DIAGNOSIS — R0602 Shortness of breath: Secondary | ICD-10-CM | POA: Diagnosis present

## 2023-05-17 DIAGNOSIS — R634 Abnormal weight loss: Secondary | ICD-10-CM | POA: Insufficient documentation

## 2023-07-23 ENCOUNTER — Ambulatory Visit: Payer: Medicare Other | Admitting: Student

## 2023-08-03 NOTE — Progress Notes (Deleted)
  Electrophysiology Office Note:   Date:  08/03/2023  ID:  Corey Suarez, DOB [birth year], MRN [id number]  Primary Cardiologist: None Electrophysiologist: Lanier Prude, MD  {Click to update primary MD,subspecialty MD or APP then REFRESH:1}    History of Present Illness:   Corey Suarez is a 73 y.o. male with h/o AF s/p ablation seen today for {VISITTYPE:28148}  Review of systems complete and found to be negative unless listed in HPI.   EP Information / Studies Reviewed:    {EKGtoday:28818}       Arrhythmia History  AF ablation 05/2022  Physical Exam:   VS:  There were no vitals taken for this visit.   Wt Readings from Last 3 Encounters:  03/18/23 184 lb 6.4 oz (83.6 kg)  09/02/22 200 lb 12.8 oz (91.1 kg)  07/01/22 203 lb 12.8 oz (92.4 kg)     GEN: No acute distress NECK: No JVD; No carotid bruits CARDIAC: {EPRHYTHM:28826}, no murmurs, rubs, gallops RESPIRATORY:  Clear to auscultation without rales, wheezing or rhonchi  ABDOMEN: Soft, non-tender, non-distended EXTREMITIES:  {EDEMA LEVEL:28147::"No"} edema; No deformity   ASSESSMENT AND PLAN:    Persistent AF EKG today shows *** S/p ablation 05/2022 Stable off multaq Continue eliquis 5 mg BID for CHA2DS2/VASc of at least 2.    Secondary hypercoagulable state Pt on Eliquis as above   {Click here to Review PMH, Prob List, Meds, Allergies, SHx, FHx  :1}   Follow up with {NWGNF:62130} {EPFOLLOW QM:57846}  Signed, Graciella Freer, PA-C

## 2023-08-04 ENCOUNTER — Ambulatory Visit: Payer: Medicare Other | Admitting: Student

## 2023-08-04 DIAGNOSIS — D6869 Other thrombophilia: Secondary | ICD-10-CM

## 2023-08-04 DIAGNOSIS — I4819 Other persistent atrial fibrillation: Secondary | ICD-10-CM

## 2023-08-04 NOTE — Progress Notes (Signed)
  Electrophysiology Office Note:   Date:  08/06/2023  ID:  Corey Suarez, DOB Dec 05, 1950, MRN [id number]  Primary Cardiologist: None Electrophysiologist: Lanier Prude, MD      History of Present Illness:   Corey Suarez is a 73 y.o. male with h/o AF s/p ablation seen today for routine electrophysiology followup.   Since last being seen in our clinic the patient reports doing very poorly.  He has had unexplained weight loss of nearly 40 lbs and has progressive SOB. He has prostate CA of a "favorable" intermediate risk. He is sleeping very poorly and has SOB with any exertion. Denies syncope, edema, or chest pain.  Had PFTs ordered in September that have not been done yet.  CT scans have been unremarkable, as has TSH, CBC, and BNP.  Pt and wife are very frustrated with lack of diagnosis or improvement in symptoms.   Review of systems complete and found to be negative unless listed in HPI.   EP Information / Studies Reviewed:    EKG is ordered today. Personal review as below.  EKG Interpretation Date/Time:  Friday August 06 2023 09:00:47 EST Ventricular Rate:  89 PR Interval:  156 QRS Duration:  78 QT Interval:  360 QTC Calculation: 438 R Axis:   14  Text Interpretation: Normal sinus rhythm Normal ECG When compared with ECG of 18-Mar-2023 14:02, No significant change was found Confirmed by Maxine Glenn [phone]) on 08/06/2023 9:06:52 AM    Arrhythmia History  AF ablation 05/2022  Physical Exam:   VS:  BP 112/68   Pulse 89   Ht 5\' 11"  (1.803 m)   Wt 173 lb 3.2 oz (78.6 kg)   SpO2 97%   BMI 24.16 kg/m    Wt Readings from Last 3 Encounters:  08/06/23 173 lb 3.2 oz (78.6 kg)  03/18/23 184 lb 6.4 oz (83.6 kg)  09/02/22 200 lb 12.8 oz (91.1 kg)     GEN: No acute distress NECK: No JVD; No carotid bruits CARDIAC: Regular rate and rhythm, no murmurs, rubs, gallops RESPIRATORY:  Clear to auscultation without rales, wheezing or rhonchi  ABDOMEN: Soft,  non-tender, non-distended EXTREMITIES:  No edema; No deformity   ASSESSMENT AND PLAN:    Persistent AF EKG today shows NSR S/p ablation 05/2022. Encouragement given that this affects the electrical system of the heart, and the procedure itself is very unlikely to be contributing to his symptomology.  Stable off multaq Continue Xarelto 20 mg daily for CHA2DS2/VASc of at least 2  Secondary hypercoagulable state Pt on Eliquis as above   Unexplained Weight Loss Fatigue Dyspnea On going work up has been unremarkable including imaging and labwork.  Update Echo.  PFTs are already ordered.  Will also refer to Pulmonology for unexplained Dyspnea pending PFTs.  Pt and family are desperate to find a unifying diagnosis and get symptom relief He denies snoring but worries about sleep apnea with significant fatigue and poor sleep. Can revisit based on above work up.   Prostate CA Follows with urology Per notes is a favorable risk profile, and not felt to be contributing to his other overall symptoms.   Follow up with EP APP in 6 months from arrhyhtmia perspective. Otherwise, appropriate specialist based on work up.   Signed, Graciella Freer, PA-C

## 2023-08-06 ENCOUNTER — Ambulatory Visit: Payer: Medicare Other | Attending: Student | Admitting: Student

## 2023-08-06 ENCOUNTER — Telehealth: Payer: Self-pay | Admitting: Student

## 2023-08-06 ENCOUNTER — Ambulatory Visit (HOSPITAL_COMMUNITY): Payer: Medicare Other | Attending: Student

## 2023-08-06 ENCOUNTER — Encounter: Payer: Self-pay | Admitting: Student

## 2023-08-06 VITALS — BP 112/68 | HR 89 | Ht 71.0 in | Wt 173.2 lb

## 2023-08-06 DIAGNOSIS — I4819 Other persistent atrial fibrillation: Secondary | ICD-10-CM

## 2023-08-06 DIAGNOSIS — R0600 Dyspnea, unspecified: Secondary | ICD-10-CM | POA: Insufficient documentation

## 2023-08-06 DIAGNOSIS — R5383 Other fatigue: Secondary | ICD-10-CM

## 2023-08-06 DIAGNOSIS — D6869 Other thrombophilia: Secondary | ICD-10-CM | POA: Diagnosis not present

## 2023-08-06 LAB — ECHOCARDIOGRAM COMPLETE
Area-P 1/2: 3.79 cm2
Height: 71 in
P 1/2 time: 507 ms
S' Lateral: 2.4 cm
Weight: 2771.2 [oz_av]

## 2023-08-06 NOTE — Patient Instructions (Signed)
Medication Instructions:  Your physician recommends that you continue on your current medications as directed. Please refer to the Current Medication list given to you today.  *If you need a refill on your cardiac medications before your next appointment, please call your pharmacy*  Lab Work: None ordered If you have labs (blood work) drawn today and your tests are completely normal, you will receive your results only by: MyChart Message (if you have MyChart) OR A paper copy in the mail If you have any lab test that is abnormal or we need to change your treatment, we will call you to review the results.  Testing/Procedures: Schedule pulmonary function test as previously ordered  Your physician has requested that you have an echocardiogram. Echocardiography is a painless test that uses sound waves to create images of your heart. It provides your doctor with information about the size and shape of your heart and how well your heart's chambers and valves are working. This procedure takes approximately one hour. There are no restrictions for this procedure. Please do NOT wear cologne, perfume, aftershave, or lotions (deodorant is allowed). Please arrive 15 minutes prior to your appointment time.  Please note: We ask at that you not bring children with you during ultrasound (echo/ vascular) testing. Due to room size and safety concerns, children are not allowed in the ultrasound rooms during exams. Our front office staff cannot provide observation of children in our lobby area while testing is being conducted. An adult accompanying a patient to their appointment will only be allowed in the ultrasound room at the discretion of the ultrasound technician under special circumstances. We apologize for any inconvenience.    Follow-Up: At [facility], you and your health needs are our priority.  As part of our continuing mission to provide you with exceptional heart care, we have created  designated Provider Care Teams.  These Care Teams include your primary Cardiologist (physician) and Advanced Practice Providers (APPs -  Physician Assistants and Nurse Practitioners) who all work together to provide you with the care you need, when you need it.   Your next appointment:   6 month(s)  Provider:   Baldwin Crown" Grindstone, PA-C   You have been referred to pulmonology.

## 2023-08-06 NOTE — Telephone Encounter (Signed)
New message   Patient was seen today.  At check out, his wife stated that patient request a sleep study.  He does not snore but when he is asleep, his mouth is open.  Patient had a sleep test approx 36yrs ago.  He does not have a CPAP.  Please call and let them know if it is possible to get a sleep study.

## 2023-08-10 NOTE — Telephone Encounter (Signed)
Called and spoke with wife, with pt there (okay to speak with wife per DPR) and she was asking our recommendation as to whether to have sleep study now or wait until after they see pulmonologist on Friday. I informed her that I could not assist with that decision but per note, Corey Suarez had discussed with them at recent office visit to have echo and pft's done prior to ordering sleep study. Wife agrees with this plan to wait until after pulmonology consult, she will let us know what they decide in regards to sleep study.

## 2023-08-13 ENCOUNTER — Ambulatory Visit (HOSPITAL_BASED_OUTPATIENT_CLINIC_OR_DEPARTMENT_OTHER): Payer: Medicare Other | Admitting: Cardiovascular Disease

## 2023-08-13 ENCOUNTER — Encounter (HOSPITAL_BASED_OUTPATIENT_CLINIC_OR_DEPARTMENT_OTHER): Payer: Self-pay | Admitting: Cardiovascular Disease

## 2023-08-13 VITALS — BP 99/65 | HR 98 | Ht 71.0 in | Wt 169.8 lb

## 2023-08-13 DIAGNOSIS — R0683 Snoring: Secondary | ICD-10-CM | POA: Diagnosis not present

## 2023-08-13 DIAGNOSIS — R0602 Shortness of breath: Secondary | ICD-10-CM

## 2023-08-13 DIAGNOSIS — Z01812 Encounter for preprocedural laboratory examination: Secondary | ICD-10-CM

## 2023-08-13 DIAGNOSIS — R400 Somnolence: Secondary | ICD-10-CM | POA: Diagnosis not present
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
## 2023-08-13 NOTE — Patient Instructions (Addendum)
Medication Instructions:  Your physician recommends that you continue on your current medications as directed. Please refer to the Current Medication list given to you today.   Labwork: BMET/CBC WITHIN A WEEK OF CATH  Testing/Procedures: Your physician has requested that you have a cardiac catheterization. Cardiac catheterization is used to diagnose and/or treat various heart conditions. Doctors may recommend this procedure for a number of different reasons. The most common reason is to evaluate chest pain. Chest pain can be a symptom of coronary artery disease (CAD), and cardiac catheterization can show whether plaque is narrowing or blocking your heart's arteries. This procedure is also used to evaluate the valves, as well as measure the blood flow and oxygen levels in different parts of your heart. For further information please visit [email or web address] Please follow instruction sheet, as given.  VQ SCAN - WILL NEED CHEST XRAY PRIOR   ITAMAR HOME SLEEP STUDY  THE OFFICE WILL CALL YOU WITH CODE ONCE INSURANCE HAS BEEN REVIEWED   Follow-Up: AS NEEDED   You have been referred to PULMONARY  IF YOU DO NOT HEAR FROM THEM IN 2 WEEKS YOU CAN CALL THE NUMBER HIGHLIGHTED   Any Other Special Instructions Will Be Listed Below (If Applicable).   [facility], [facility], [facility] & VASCULAR AT [facility] [address] [place] [address] Dept: [phone]  LINFORD QUINTELA  08/13/2023  You are scheduled for a Cardiac Catheterization on Wednesday, February 12 with Dr. Alverda Skeans.  1. Please arrive at the [facility] (Main Entrance A) at [facility]: [address] [place], [place] [address] at 8:00 AM (This time is 2 hour(s) before your procedure to ensure your preparation).   Free valet parking service is available. You will check in at ADMITTING. The support person will be asked to wait in the waiting room.  It is OK to have  someone drop you off and come back when you are ready to be discharged.    Special note: Every effort is made to have your procedure done on time. Please understand that emergencies sometimes delay scheduled procedures.  2. Diet: Do not eat solid foods after midnight.  The patient may have clear liquids until 5am upon the day of the procedure.  3. Labs: You will need to have blood drawn WITHIN A WEEK OF YOUR CATH You do not need to be fasting.  4. Medication instructions in preparation for your procedure:   Contrast Allergy: No  HOLD YOUR XARELTO 2 DAYS PRIOR TO CATH   On the morning of your procedure, take your any morning medicines NOT listed above.  You may use sips of water.  5. Plan to go home the same day, you will only stay overnight if medically necessary. 6. Bring a current list of your medications and current insurance cards. 7. You MUST have a responsible person to drive you home. 8. Someone MUST be with you the first 24 hours after you arrive home or your discharge will be delayed. 9. Please wear clothes that are easy to get on and off and wear slip-on shoes.  Thank you for allowing [place] to care for you!   -- [facility]  If you need a refill on your cardiac medications before your next appointment, please call your pharmacy.  WatchPAT?  Is a FDA cleared portable home sleep study test that uses a watch and 3 points of contact to monitor 7 different channels, including your heart rate,  oxygen saturations, body position, snoring, and chest motion.  The study is easy to use from the comfort of your own home and accurately detect sleep apnea.  Before bed, you attach the chest sensor, attached the sleep apnea bracelet to your nondominant hand, and attach the finger probe.  After the study, the raw data is downloaded from the watch and scored for apnea events.   For more information: [email or web address]  Patient Testing  Instructions:  Do not put battery into the device until bedtime when you are ready to begin the test. Please call the support number if you need assistance after following the instructions below: 24 hour support line- [phone] or ITAMAR support at [phone] (option 2)  Download the IntelWatchPAT One" app through the google play store or App Store  Be sure to turn on or enable access to bluetooth in settlings on your smartphone/ device  Make sure no other bluetooth devices are on and within the vicinity of your smartphone/ device and WatchPAT watch during testing.  Make sure to leave your smart phone/ device plugged in and charging all night.  When ready for bed:  Follow the instructions step by step in the WatchPAT One App to activate the testing device. For additional instructions, including video instruction, visit the WatchPAT One video on Youtube. You can search for WatchPat One within Youtube (video is 4 minutes and 18 seconds) or enter: https://youtube/watch?[email or web address]_vbiwxE Please note: You will be prompted to enter a Pin to connect via bluetooth when starting the test. The PIN will be assigned to you when you receive the test.  The device is disposable, but it recommended that you retain the device until you receive a call letting you know the study has been received and the results have been interpreted.  We will let you know if the study did not transmit to [place] properly after the test is completed. You do not need to call us to confirm the receipt of the test.  Please complete the test within 48 hours of receiving PIN.   Frequently Asked Questions:  What is Watch Dennie Bible one?  A single use fully disposable home sleep apnea testing device and will not need to be returned after completion.  What are the requirements to use WatchPAT one?  The be able to have a successful watchpat one sleep study, you should have your Watch pat one device, your smart phone, watch pat one app, your PIN  number and Internet access What type of phone do I need?  You should have a smart phone that uses Android 5.1 and above or any Iphone with IOS 10 and above How can I download the WatchPAT one app?  Based on your device type search for WatchPAT one app either in google play for android devices or APP store for Iphone's Where will I get my PIN for the study?  Your PIN will be provided by your physician's office. It is used for authentication and if you lose/forget your PIN, please reach out to your providers office.  I do not have Internet at home. Can I do WatchPAT one study?  WatchPAT One needs Internet connection throughout the night to be able to transmit the sleep data. You can use your home/local internet or your cellular's data package. However, it is always recommended to use home/local Internet. It is estimated that between 20MB-30MB will be used with each study.However, the application will be looking for space in the phone to start the study.  What  happens if I lose internet or bluetooth connection?  During the internet disconnection, your phone will not be able to transmit the sleep data. All the data, will be stored in your phone. As soon as the internet connection is back on, the phone will being sending the sleep data. During the bluetooth disconnection, WatchPAT one will not be able to to send the sleep data to your phone. Data will be kept in the [facility] one until two devices have bluetooth connection back on. As soon as the connection is back on, WatchPAT one will send the sleep data to the phone.  How long do I need to wear the WatchPAT one?  After you start the study, you should wear the device at least 6 hours.  How far should I keep my phone from the device?  During the night, your phone should be within 15 feet.  What happens if I leave the room for restroom or other reasons?  Leaving the room for any reason will not cause any problem. As soon as your get back to the room,  both devices will reconnect and will continue to send the sleep data. Can I use my phone during the sleep study?  Yes, you can use your phone as usual during the study. But it is recommended to put your watchpat one on when you are ready to go to bed.  How will I get my study results?  A soon as you completed your study, your sleep data will be sent to the provider. They will then share the results with you when they are ready.   WatchPAT?  Is a FDA cleared portable home sleep study test that uses a watch and 3 points of contact to monitor 7 different channels, including your heart rate, oxygen saturations, body position, snoring, and chest motion.  The study is easy to use from the comfort of your own home and accurately detect sleep apnea.  Before bed, you attach the chest sensor, attached the sleep apnea bracelet to your nondominant hand, and attach the finger probe.  After the study, the raw data is downloaded from the watch and scored for apnea events.   For more information: [email or web address]  Patient Testing Instructions:  Do not put battery into the device until bedtime when you are ready to begin the test. Please call the support number if you need assistance after following the instructions below: 24 hour support line- [phone] or ITAMAR support at [phone] (option 2)  Download the IntelWatchPAT One" app through the google play store or App Store  Be sure to turn on or enable access to bluetooth in settlings on your smartphone/ device  Make sure no other bluetooth devices are on and within the vicinity of your smartphone/ device and WatchPAT watch during testing.  Make sure to leave your smart phone/ device plugged in and charging all night.  When ready for bed:  Follow the instructions step by step in the WatchPAT One App to activate the testing device. For additional instructions, including video instruction, visit the WatchPAT One video on Youtube. You  can search for WatchPat One within Youtube (video is 4 minutes and 18 seconds) or enter: https://youtube/watch?v=[email or web address]_vbiwxE Please note: You will be prompted to enter a Pin to connect via bluetooth when starting the test. The PIN will be assigned to you when you receive the test.  The device is disposable, but it recommended that you retain the device until you receive a call letting you know the  study has been received and the results have been interpreted.  We will let you know if the study did not transmit to [place] properly after the test is completed. You do not need to call us to confirm the receipt of the test.  Please complete the test within 48 hours of receiving PIN.   Frequently Asked Questions:  What is Watch Dennie Bible one?  A single use fully disposable home sleep apnea testing device and will not need to be returned after completion.  What are the requirements to use WatchPAT one?  The be able to have a successful watchpat one sleep study, you should have your Watch pat one device, your smart phone, watch pat one app, your PIN number and Internet access What type of phone do I need?  You should have a smart phone that uses Android 5.1 and above or any Iphone with IOS 10 and above How can I download the WatchPAT one app?  Based on your device type search for WatchPAT one app either in google play for android devices or APP store for Iphone's Where will I get my PIN for the study?  Your PIN will be provided by your physician's office. It is used for authentication and if you lose/forget your PIN, please reach out to your providers office.  I do not have Internet at home. Can I do WatchPAT one study?  WatchPAT One needs Internet connection throughout the night to be able to transmit the sleep data. You can use your home/local internet or your cellular's data package. However, it is always recommended to use home/local Internet. It is estimated that between 20MB-30MB will be used with each  study.However, the application will be looking for space in the phone to start the study.  What happens if I lose internet or bluetooth connection?  During the internet disconnection, your phone will not be able to transmit the sleep data. All the data, will be stored in your phone. As soon as the internet connection is back on, the phone will being sending the sleep data. During the bluetooth disconnection, WatchPAT one will not be able to to send the sleep data to your phone. Data will be kept in the [facility] one until two devices have bluetooth connection back on. As soon as the connection is back on, WatchPAT one will send the sleep data to the phone.  How long do I need to wear the WatchPAT one?  After you start the study, you should wear the device at least 6 hours.  How far should I keep my phone from the device?  During the night, your phone should be within 15 feet.  What happens if I leave the room for restroom or other reasons?  Leaving the room for any reason will not cause any problem. As soon as your get back to the room, both devices will reconnect and will continue to send the sleep data. Can I use my phone during the sleep study?  Yes, you can use your phone as usual during the study. But it is recommended to put your watchpat one on when you are ready to go to bed.  How will I get my study results?  A soon as you completed your study, your sleep data will be sent to the provider. They will then share the results with you when they are ready.
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
## 2023-08-16 ENCOUNTER — Telehealth (HOSPITAL_BASED_OUTPATIENT_CLINIC_OR_DEPARTMENT_OTHER): Payer: Self-pay | Admitting: *Deleted

## 2023-08-16 NOTE — Telephone Encounter (Addendum)
2/19 arrive at 9:30 am 11:30 cath with Dr Clifton James   Advised patient and sent an updated cath letter

## 2023-08-18 ENCOUNTER — Telehealth: Payer: Self-pay

## 2023-08-18 NOTE — Telephone Encounter (Signed)
 Spoke with patient's wife. Patient will come to [facility] office to pick up Itamar device and receive patient education. Ordering provider: [facility] Associated diagnoses: Snoring, Daytime Somnolence WatchPAT PA obtained on 08/18/2023 by Connell JAYSON Boers, LPN. Authorization: Yes; tracking ID [id number] Patient notified of PIN ([id number]) on 08/18/2023 via Notification Method: phone.

## 2023-08-18 NOTE — Telephone Encounter (Signed)
 Spoke with wife and advised Itamar/WatchPat given to patient at visit  She is out of town and will discuss with him when she return Advised to call if any issues

## 2023-08-24 ENCOUNTER — Encounter (HOSPITAL_COMMUNITY): Payer: Medicare Other

## 2023-08-28 LAB — CBC WITH DIFFERENTIAL/PLATELET
Basophils Absolute: 0 10*3/uL (ref 0.0–0.2)
Basos: 0 %
EOS (ABSOLUTE): 0 10*3/uL (ref 0.0–0.4)
Eos: 1 %
Hematocrit: 48.5 % (ref 37.5–51.0)
Hemoglobin: 15.3 g/dL (ref 13.0–17.7)
Immature Grans (Abs): 0 10*3/uL (ref 0.0–0.1)
Immature Granulocytes: 1 %
Lymphocytes Absolute: 1.2 10*3/uL (ref 0.7–3.1)
Lymphs: 17 %
MCH: 28.6 pg (ref 26.6–33.0)
MCHC: 31.5 g/dL (ref 31.5–35.7)
MCV: 91 fL (ref 79–97)
Monocytes Absolute: 0.6 10*3/uL (ref 0.1–0.9)
Monocytes: 9 %
Neutrophils Absolute: 5.2 10*3/uL (ref 1.4–7.0)
Neutrophils: 72 %
Platelets: 306 10*3/uL (ref 150–450)
RBC: 5.35 x10E6/uL (ref 4.14–5.80)
RDW: 13.4 % (ref 11.6–15.4)
WBC: 7.1 10*3/uL (ref 3.4–10.8)

## 2023-08-28 LAB — BASIC METABOLIC PANEL
BUN/Creatinine Ratio: 19 (ref 10–24)
BUN: 18 mg/dL (ref 8–27)
CO2: 34 mmol/L — ABNORMAL HIGH (ref 20–29)
Calcium: 9.6 mg/dL (ref 8.6–10.2)
Chloride: 94 mmol/L — ABNORMAL LOW (ref 96–106)
Creatinine, Ser: 0.94 mg/dL (ref 0.76–1.27)
Glucose: 86 mg/dL (ref 70–99)
Potassium: 4.2 mmol/L (ref 3.5–5.2)
Sodium: 142 mmol/L (ref 134–144)
eGFR: 86 mL/min/{1.73_m2} (ref >59)

## 2023-08-31 ENCOUNTER — Encounter (HOSPITAL_COMMUNITY)
Admission: RE | Admit: 2023-08-31 | Discharge: 2023-08-31 | Disposition: A | Discharge status: Home / Self Care | Payer: Medicare Other | Source: Ambulatory Visit | Attending: Cardiovascular Disease | Admitting: Cardiovascular Disease

## 2023-08-31 ENCOUNTER — Other Ambulatory Visit (HOSPITAL_BASED_OUTPATIENT_CLINIC_OR_DEPARTMENT_OTHER): Payer: Self-pay | Admitting: Cardiovascular Disease

## 2023-08-31 ENCOUNTER — Ambulatory Visit (HOSPITAL_COMMUNITY)
Admission: RE | Admit: 2023-08-31 | Discharge: 2023-08-31 | Disposition: A | Discharge status: Home / Self Care | Payer: Medicare Other | Source: Ambulatory Visit | Attending: Cardiovascular Disease | Admitting: Cardiovascular Disease

## 2023-08-31 DIAGNOSIS — R0602 Shortness of breath: Secondary | ICD-10-CM

## 2023-08-31 DIAGNOSIS — Z01812 Encounter for preprocedural laboratory examination: Secondary | ICD-10-CM

## 2023-08-31 DIAGNOSIS — R0683 Snoring: Secondary | ICD-10-CM

## 2023-08-31 DIAGNOSIS — R400 Somnolence: Secondary | ICD-10-CM

## 2023-08-31 MED ORDER — TECHNETIUM TO 99M ALBUMIN AGGREGATED
3.9000 | Freq: Once | INTRAVENOUS | Status: AC | PRN
  Administered 2023-08-31: 3.9 via INTRAVENOUS

## 2023-09-01 ENCOUNTER — Ambulatory Visit (HOSPITAL_COMMUNITY)
Admission: RE | Admit: 2023-09-01 | Discharge: 2023-09-01 | Disposition: A | Discharge status: Home / Self Care | Payer: Medicare Other | Attending: Cardiovascular Disease | Admitting: Cardiovascular Disease

## 2023-09-01 ENCOUNTER — Encounter (HOSPITAL_COMMUNITY)
Admission: RE | Disposition: A | Discharge status: Home / Self Care | Payer: Self-pay | Source: Home / Self Care | Attending: Cardiovascular Disease

## 2023-09-01 ENCOUNTER — Other Ambulatory Visit: Payer: Self-pay

## 2023-09-01 ENCOUNTER — Encounter (HOSPITAL_BASED_OUTPATIENT_CLINIC_OR_DEPARTMENT_OTHER): Payer: Self-pay | Admitting: Cardiovascular Disease

## 2023-09-01 ENCOUNTER — Encounter (HOSPITAL_BASED_OUTPATIENT_CLINIC_OR_DEPARTMENT_OTHER): Payer: Self-pay

## 2023-09-01 DIAGNOSIS — Z77090 Contact with and (suspected) exposure to asbestos: Secondary | ICD-10-CM | POA: Diagnosis not present

## 2023-09-01 DIAGNOSIS — Z6823 Body mass index (BMI) 23.0-23.9, adult: Secondary | ICD-10-CM | POA: Insufficient documentation

## 2023-09-01 DIAGNOSIS — C61 Malignant neoplasm of prostate: Secondary | ICD-10-CM | POA: Insufficient documentation

## 2023-09-01 DIAGNOSIS — R0602 Shortness of breath: Secondary | ICD-10-CM | POA: Diagnosis present

## 2023-09-01 DIAGNOSIS — G4730 Sleep apnea, unspecified: Secondary | ICD-10-CM | POA: Diagnosis not present

## 2023-09-01 DIAGNOSIS — R634 Abnormal weight loss: Secondary | ICD-10-CM | POA: Diagnosis not present

## 2023-09-01 HISTORY — PX: RIGHT HEART CATH: CATH118263

## 2023-09-01 LAB — POCT I-STAT EG7
Acid-Base Excess: 7 mmol/L — ABNORMAL HIGH (ref 0.0–2.0)
Bicarbonate: 35.9 mmol/L — ABNORMAL HIGH (ref 20.0–28.0)
Calcium, Ion: 1.18 mmol/L (ref 1.15–1.40)
HCT: 45 % (ref 39.0–52.0)
Hemoglobin: 15.3 g/dL (ref 13.0–17.0)
O2 Saturation: 61 %
Potassium: 4.1 mmol/L (ref 3.5–5.1)
Sodium: 141 mmol/L (ref 135–145)
TCO2: 38 mmol/L — ABNORMAL HIGH (ref 22–32)
pCO2, Ven: 69 mm[Hg] — ABNORMAL HIGH (ref 44–60)
pH, Ven: 7.324 (ref 7.25–7.43)
pO2, Ven: 36 mm[Hg] (ref 32–45)

## 2023-09-01 SURGERY — RIGHT HEART CATH
Anesthesia: LOCAL

## 2023-09-01 MED ORDER — MIDAZOLAM HCL 2 MG/2ML IJ SOLN
INTRAMUSCULAR | Status: AC
  Filled 2023-09-01: qty 2

## 2023-09-01 MED ORDER — FENTANYL CITRATE (PF) 100 MCG/2ML IJ SOLN
INTRAMUSCULAR | Status: DC | PRN
  Administered 2023-09-01: 25 ug via INTRAVENOUS

## 2023-09-01 MED ORDER — LIDOCAINE HCL (PF) 1 % IJ SOLN
INTRAMUSCULAR | Status: AC
  Filled 2023-09-01: qty 30

## 2023-09-01 MED ORDER — SODIUM CHLORIDE 0.9% FLUSH: 3.0000 mL | INTRAVENOUS | Status: DC | PRN

## 2023-09-01 MED ORDER — HYDRALAZINE HCL 20 MG/ML IJ SOLN: 10.0000 mg | INTRAMUSCULAR | Status: DC | PRN

## 2023-09-01 MED ORDER — LABETALOL HCL 5 MG/ML IV SOLN: 10.0000 mg | INTRAVENOUS | Status: DC | PRN

## 2023-09-01 MED ORDER — SODIUM CHLORIDE 0.9% FLUSH: 3.0000 mL | Freq: Two times a day (BID) | INTRAVENOUS | Status: DC

## 2023-09-01 MED ORDER — SODIUM CHLORIDE 0.9 % IV SOLN: INTRAVENOUS | Status: DC

## 2023-09-01 MED ORDER — MIDAZOLAM HCL 2 MG/2ML IJ SOLN
INTRAMUSCULAR | Status: DC | PRN
  Administered 2023-09-01: 1 mg via INTRAVENOUS

## 2023-09-01 MED ORDER — ONDANSETRON HCL 4 MG/2ML IJ SOLN: 4.0000 mg | Freq: Four times a day (QID) | INTRAMUSCULAR | Status: DC | PRN

## 2023-09-01 MED ORDER — HEPARIN (PORCINE) IN NACL 1000-0.9 UT/500ML-% IV SOLN
INTRAVENOUS | Status: DC | PRN
  Administered 2023-09-01: 500 mL

## 2023-09-01 MED ORDER — LIDOCAINE HCL (PF) 1 % IJ SOLN
INTRAMUSCULAR | Status: DC | PRN
  Administered 2023-09-01: 2 mL

## 2023-09-01 MED ORDER — ACETAMINOPHEN 325 MG PO TABS
650.0000 mg | ORAL_TABLET | ORAL | Status: DC | PRN
Start: 2023-09-01 — End: 2023-09-01

## 2023-09-01 MED ORDER — FENTANYL CITRATE (PF) 100 MCG/2ML IJ SOLN
INTRAMUSCULAR | Status: AC
  Filled 2023-09-01: qty 2

## 2023-09-01 MED ORDER — SODIUM CHLORIDE 0.9 % IV SOLN: 250.0000 mL | INTRAVENOUS | Status: DC | PRN

## 2023-09-01 SURGICAL SUPPLY — 5 items
CATH BALLN WEDGE 5F 110CM (CATHETERS) IMPLANT
PACK CARDIAC CATHETERIZATION (CUSTOM PROCEDURE TRAY) IMPLANT
SHEATH GLIDE SLENDER 4/5FR (SHEATH) IMPLANT
TRANSDUCER W/STOPCOCK (MISCELLANEOUS) IMPLANT
TUBING ART PRESS 72 MALE/FEM (TUBING) IMPLANT

## 2023-09-01 NOTE — Discharge Instructions (Signed)

## 2023-09-01 NOTE — Interval H&P Note (Signed)
 History and Physical Interval Note:  09/01/2023 11:33 AM  Corey Suarez  has presented today for surgery, with the diagnosis of shortness of breath.  The various methods of treatment have been discussed with the patient and family. After consideration of risks, benefits and other options for treatment, the patient has consented to  Procedure(s): RIGHT HEART CATH (N/A) as a surgical intervention.  The patient's history has been reviewed, patient examined, no change in status, stable for surgery.  I have reviewed the patient's chart and labs.  Questions were answered to the patient's satisfaction.     Tonny Bollman

## 2023-09-02 ENCOUNTER — Encounter (HOSPITAL_COMMUNITY): Payer: Self-pay | Admitting: Cardiovascular Disease

## 2023-09-12 NOTE — Progress Notes (Deleted)
   Corey Suarez, male    DOB: 08-14-1950    MRN: [id number]   Brief patient profile:  ***  yo*** *** referred to pulmonary clinic in [place]  09/15/2023 by *** for ***   RHC   09/01/2023 nl pressures  / low CO   History of Present Illness  09/15/2023  Pulmonary/ 1st office eval/ Sherene Sires / [place] Office  No chief complaint on file.    Dyspnea:  *** Cough: *** Sleep: *** SABA use: *** 02: *** LDSCT:***  No obvious day to day or daytime pattern/variability or assoc excess/ purulent sputum or mucus plugs or hemoptysis or cp or chest tightness, subjective wheeze or overt sinus or hb symptoms.    Also denies any obvious fluctuation of symptoms with weather or environmental changes or other aggravating or alleviating factors except as outlined above   No unusual exposure hx or h/o childhood pna/ asthma or knowledge of premature birth.  Current Allergies, Complete Past Medical History, Past Surgical History, Family History, and Social History were reviewed in [employer] record.  ROS  The following are not active complaints unless bolded Hoarseness, sore throat, dysphagia, dental problems, itching, sneezing,  nasal congestion or discharge of excess mucus or purulent secretions, ear ache,   fever, chills, sweats, unintended wt loss or wt gain, classically pleuritic or exertional cp,  orthopnea pnd or arm/hand swelling  or leg swelling, presyncope, palpitations, abdominal pain, anorexia, nausea, vomiting, diarrhea  or change in bowel habits or change in bladder habits, change in stools or change in urine, dysuria, hematuria,  rash, arthralgias, visual complaints, headache, numbness, weakness or ataxia or problems with walking or coordination,  change in mood or  memory.            Outpatient Medications Prior to Visit  Medication Sig Dispense Refill   brimonidine (ALPHAGAN) 0.2 % ophthalmic solution Place 1 drop into the left eye in the morning and at bedtime.      Cholecalciferol (VITAMIN D3) 125 MCG (5000 UT) TABS Take 5,000 Units by mouth daily.     diltiazem (CARDIZEM) 30 MG tablet Take 1 tablet every 4 hours AS NEEDED for heart rate >100 as long as blood pressure >100. 30 tablet 1   finasteride (PROSCAR) 5 MG tablet Take 5 mg by mouth daily.     latanoprost (XALATAN) 0.005 % ophthalmic solution Place 1 drop into both eyes at bedtime.     Multiple Vitamin (MULTIVITAMIN WITH MINERALS) TABS tablet Take 1 tablet by mouth every evening.     rivaroxaban (XARELTO) 20 MG TABS tablet Take 1 tablet (20 mg total) by mouth daily with supper. 30 tablet 11   vitamin C (ASCORBIC ACID) 500 MG tablet Take 500 mg by mouth daily.     vitamin E 180 MG (400 UNITS) capsule Take 400 Units by mouth daily.     zinc gluconate 50 MG tablet Take 50 mg by mouth daily.     No facility-administered medications prior to visit.    Past Medical History:  Diagnosis Date   Arrhythmia       Objective:     There were no vitals taken for this visit.         Assessment   No problem-specific Assessment & Plan notes found for this encounter.     Sandrea Hughs, MD 09/12/2023

## 2023-09-14 ENCOUNTER — Telehealth: Payer: Self-pay | Admitting: Internal Medicine
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
## 2023-09-15 ENCOUNTER — Institutional Professional Consult (permissible substitution): Payer: Medicare Other | Admitting: Internal Medicine

## 2023-09-16 NOTE — Progress Notes (Signed)
 Corey Suarez, male    DOB: Feb 18, 1951    MRN: [id number]   Brief patient profile:  59   yowm never smoker from [facility]  referred to pulmonary clinic in [place]  09/17/2023 by Maxine Glenn PA  for unexplained sob p ET for ablation.     [employer] Nutritional therapist / served in  Engineer, technical sales room    Prior to ablation at [facility] 06/03/22  could weed wack all day  Wife say says took meds x 40 days to help throat did nothing - not sure what it was  [facility] 04/20/23 for LPR with nl scope Barium esophogram 02/04/23 demonstrated normal esophageal motility with mild reflux   [facility]   09/01/2023 nl pressures  / low CO    History of Present Illness  09/17/2023  Pulmonary/ 1st office eval/ [name] / [place] Office  Chief Complaint  Patient presents with   Consult    Having sob   Dyspnea:  100 yards    mb is 75 ft slt uphill to mb  Cough: still feels globus and urge to clear throat  Sleep: 30 degrees in recliner / bed 45 degrees otherwises can't breathe "points to suprasternal notch where feels "blocked" "choked" esp if try to lie flatter or extend neck  SABA use: never needed  02: none  LDSCT:none  "Nothing ever worked" but maybe gabapentin  (may have misunderstood instructions cause "stopped it when got better"  - no reported side effects   No obvious day to day or daytime pattern/variability or assoc excess/ purulent sputum or mucus plugs or hemoptysis or cp or chest tightness, subjective wheeze or overt sinus or hb symptoms.    Also denies any obvious fluctuation of symptoms with weather or environmental changes or other aggravating or alleviating factors except as outlined above   No unusual exposure hx or h/o childhood pna/ asthma or knowledge of premature birth.  Current Allergies, Complete Past Medical History, Past Surgical History, Family History, and Social History were reviewed in [employer] record.  ROS  The following are not active complaints unless  bolded Hoarseness, sore throat, dysphagia = globus  dental problems, itching, sneezing,  nasal congestion or discharge of excess mucus or purulent secretions, ear ache,   fever, chills, sweats, unintended wt loss or wt gain, classically pleuritic or exertional cp,  orthopnea pnd or arm/hand swelling  or leg swelling, presyncope, palpitations, abdominal pain, anorexia, nausea, vomiting, diarrhea  or change in bowel habits or change in bladder habits, change in stools or change in urine, dysuria, hematuria,  rash, arthralgias/neck pain , visual complaints, headache, numbness, weakness or ataxia or problems with walking or coordination,  change in mood/anxious  or  memory.            Outpatient Medications Prior to Visit  Medication Sig Dispense Refill   brimonidine (ALPHAGAN) 0.2 % ophthalmic solution Place 1 drop into the left eye in the morning and at bedtime.     Cholecalciferol (VITAMIN D3) 125 MCG (5000 UT) TABS Take 5,000 Units by mouth daily.     finasteride (PROSCAR) 5 MG tablet Take 5 mg by mouth daily.     latanoprost (XALATAN) 0.005 % ophthalmic solution Place 1 drop into both eyes at bedtime.     magnesium gluconate (MAGONATE) 500 MG tablet Take 500 mg by mouth 2 (two) times daily.     Multiple Vitamin (MULTIVITAMIN WITH MINERALS) TABS tablet Take 1 tablet by mouth every evening.  rivaroxaban (XARELTO) 20 MG TABS tablet Take 1 tablet (20 mg total) by mouth daily with supper. 30 tablet 11   vitamin C (ASCORBIC ACID) 500 MG tablet Take 500 mg by mouth daily.     vitamin E 180 MG (400 UNITS) capsule Take 400 Units by mouth daily.     zinc gluconate 50 MG tablet Take 50 mg by mouth daily.     diltiazem (CARDIZEM) 30 MG tablet Take 1 tablet every 4 hours AS NEEDED for heart rate >100 as long as blood pressure >100. 30 tablet 1   No facility-administered medications prior to visit.    Past Medical History:  Diagnosis Date   Arrhythmia       Objective:     BP 92/60   Pulse 97    Ht 5\' 10"  (1.778 m)   Wt 167 lb (75.8 kg)   SpO2 91% Comment: roon air  BMI 23.96 kg/m   SpO2: 91 % (roon air)   Hoarse sm wm/ neck flexed/ mod kyphosis  panting breathing pattern   HEENT : Oropharynx  clear      Nasal turbinates nl    NECK :  without  apparent JVD/ palpable Nodes/TM    LUNGS: no acc muscle use,  Nl contour chest which is clear to A and P bilaterally without cough on insp or exp maneuvers   CV:  RRR  no s3 or murmur or increase in P2, and no edema   ABD:  soft and nontender   MS:  Gait nl  ext warm without deformities Or obvious joint restrictions  calf tenderness, cyanosis or clubbing    SKIN: warm and dry without lesions    NEURO:  alert, approp, nl sensorium with  no motor or cerebellar deficits apparent.      I personally reviewed images and agree with radiology impression as follows:   -Chest CT w/o contrast   05/17/23  Bibasilar subsegmental volume loss. Lungs are otherwise clear. Scattered pleural calcifications in the left hemithorax. No pleural fluid. Airway is unremarkable.     - Vq 08/31/23 low prob      Assessment   DOE (dyspnea on exertion) Onset p ET for ablation on 06/03/22  - 09/17/2023   Walked on RA  x  3  lap(s) =  approx 450  ft  @ moderate pace, stopped due to end of study s sob  with lowest 02 sats 94% but back up to 95%  prior to stopping  - SNIFF 09/17/2023 >>>  - refer to Dr Lynelle Doctor >>   He has minimal scarring in L base on plain cxr and no evidence at all of a pulmonary limitation on a walk today despite appearing to pant at rest with HC03 = 34  with well compensate resp acidosis on ABG? Etiology ?   Suspect  also element now of deconditioning with  most of his "resp problems" located in his upper airway and nothing specifically to suggest in terms of inhalers.  Would like to do SNIFF asap to see if for some reason he has diaphragm dysfunction explaining his hypercarbia and cxr showing relatively low lung volumes esp on L    Upper airway cough syndrome Onset p ablation  06/03/22  / assoc with neck pain  - 09/17/2023 trial of max gerd  rx and gabapentin titrated to max of 100 mg qid   Upper airway cough syndrome (previously labeled PNDS),  is so named because it's frequently impossible to sort  out how much is  CR/sinusitis with freq throat clearing (which can be related to primary GERD)   vs  causing  secondary (" extra esophageal")  GERD from wide swings in gastric pressure that occur with throat clearing, often  promoting self use of mint and menthol lozenges that reduce the lower esophageal sphincter tone and exacerbate the problem further in a cyclical fashion.   These are the same pts (now being labeled as having "irritable larynx syndrome" by some cough centers) who not infrequently have a history of having failed to tolerate ace inhibitors,  dry powder inhalers or biphosphonates or report having atypical/extraesophageal reflux symptoms(LPR)  that don't respond to standard doses of PPI  and are easily confused as having aecopd or asthma flares by even experienced allergists/ pulmonologists (myself included).   Of the three most common causes of  Sub-acute / recurrent or chronic cough, only one (GERD)  can actually contribute to/ trigger  the other two (asthma and post nasal drip syndrome)  and perpetuate the cylce of cough.  While not intuitively obvious, many patients with chronic low grade reflux do not cough until there is a primary insult that disturbs the protective epithelial barrier and exposes sensitive nerve endings.   This is typically viral but can due to PNDS and  either may apply here.   The point is that once this occurs, it is difficult to eliminate the cycle  using anything but a maximally effective acid suppression regimen at least in the short run, accompanied by an appropriate diet to address non acid GERD and control / eliminate the cough/ throat clearing with gabapentin titrated as above >>> also  added  depomedrol 120 mg IM in case of component of Th-2 driven upper or lower airways inflammation (if cough responds short term only to relapse before return while will on full rx for uacs (as above), then that would point to allergic rhinitis/ asthma or eos bronchitis as alternative dx)      Rec: As per above F/u in 6 weeks with all meds in hand using a trust but verify approach to confirm accurate Medication  Reconciliation The principal here is that until we are certain that the  patients are doing what we've asked, it makes no sense to ask them to do more.   Each maintenance medication was reviewed in detail including emphasizing most importantly the difference between maintenance and prns and under what circumstances the prns are to be triggered using an action plan format where appropriate.  Total time for H and P, chart review, counseling,  directly observing portions of ambulatory 02 saturation study/ and generating customized AVS unique to this office visit / same day charting = 61 minutes                 Sandrea Hughs, MD 09/17/2023

## 2023-09-17 ENCOUNTER — Ambulatory Visit: Admitting: Internal Medicine

## 2023-09-17 ENCOUNTER — Encounter: Payer: Self-pay | Admitting: Internal Medicine

## 2023-09-17 VITALS — BP 92/60 | HR 97 | Ht 70.0 in | Wt 167.0 lb

## 2023-09-17 DIAGNOSIS — R058 Other specified cough: Secondary | ICD-10-CM | POA: Diagnosis not present

## 2023-09-17 DIAGNOSIS — R0609 Other forms of dyspnea: Secondary | ICD-10-CM | POA: Diagnosis not present

## 2023-09-17 MED ORDER — GABAPENTIN 100 MG PO CAPS
100.0000 mg | ORAL_CAPSULE | Freq: Four times a day (QID) | ORAL | 2 refills | Status: DC
Start: 2023-09-17 — End: 2023-10-14

## 2023-09-17 MED ORDER — PANTOPRAZOLE SODIUM 40 MG PO TBEC: 40.0000 mg | DELAYED_RELEASE_TABLET | Freq: Every day | ORAL | 2 refills | Status: DC

## 2023-09-17 MED ORDER — FAMOTIDINE 20 MG PO TABS: ORAL_TABLET | ORAL | 11 refills | Status: DC

## 2023-09-17 MED ORDER — METHYLPREDNISOLONE ACETATE 80 MG/ML IJ SUSP
120.0000 mg | Freq: Once | INTRAMUSCULAR | Status: AC
  Administered 2023-09-17: 120 mg via INTRAMUSCULAR

## 2023-09-17 NOTE — Patient Instructions (Addendum)
 Pantoprazole (protonix) 40 mg   Take  30-60 min before first meal of the day and Pepcid (famotidine)  20 mg after supper until return to office - this is the best way to tell whether stomach acid is contributing to your problem.    GERD (REFLUX)  is an extremely common cause of respiratory symptoms just like yours , many times with no obvious heartburn at all.    It can be treated with medication, but also with lifestyle changes including elevation of the head of your bed (ideally with 6 -8inch blocks under the headboard of your bed),  Smoking cessation, avoidance of late meals, excessive alcohol, and avoid fatty foods, chocolate, peppermint, colas, red wine, and acidic juices such as orange juice.  NO MINT OR MENTHOL PRODUCTS SO NO COUGH DROPS - LUDEN's is ok  USE SUGARLESS CANDY INSTEAD (Jolley ranchers or Stover's or Life Savers) or even ice chips will also do - the key is to swallow to prevent all throat clearing. NO OIL BASED VITAMINS - use powdered substitutes.  Avoid fish oil when coughing.   Irritable larynx syndrome   My office will be contacting you by phone for referral to Dr Lynelle Doctor  and schedule a sniff test - if you don't hear back from my office within one week please call us back or notify us thru MyChart and we'll address it right away.   Gabapentin 100 mg at bedtime for a week then add a second dose at bfast for a week, then a third at lunch and then 4th at supper.    Please schedule a follow up office visit in 6 weeks, call sooner if needed with all medications /inhalers/ solutions in hand so we can verify exactly what you are taking. This includes all medications from all doctors and over the counters - PLEASE separate them into two bags:  the ones you take automatically, no matter what, vs the ones you take just when you feel you need them "BAG #2 is UP TO YOU"  - this will really help [place] help you take your medications more effectively.

## 2023-09-18 NOTE — Assessment & Plan Note (Signed)
 Onset p ET for ablation on 06/03/22  - 09/17/2023   Walked on RA  x  3  lap(s) =  approx 450  ft  @ moderate pace, stopped due to end of study s sob  with lowest 02 sats 94% but back up to 95%  prior to stopping  - SNIFF 09/17/2023 >>>  - refer to Dr Lynelle Doctor >>   He has minimal scarring in L base on plain cxr and no evidence at all of a pulmonary limitation on a walk today despite appearing to pant at rest with HC03 = 34  with well compensate resp acidosis on ABG? Etiology ?   Suspect  also element now of deconditioning with  most of his "resp problems" located in his upper airway and nothing specifically to suggest in terms of inhalers.  Would like to do SNIFF asap to see if for some reason he has diaphragm dysfunction explaining his hypercarbia and cxr showing relatively low lung volumes esp on L

## 2023-09-18 NOTE — Assessment & Plan Note (Addendum)
 Onset p ablation  06/03/22  / assoc with neck pain  - 09/17/2023 trial of max gerd  rx and gabapentin titrated to max of 100 mg qid   Upper airway cough syndrome (previously labeled PNDS),  is so named because it's frequently impossible to sort out how much is  CR/sinusitis with freq throat clearing (which can be related to primary GERD)   vs  causing  secondary (" extra esophageal")  GERD from wide swings in gastric pressure that occur with throat clearing, often  promoting self use of mint and menthol lozenges that reduce the lower esophageal sphincter tone and exacerbate the problem further in a cyclical fashion.   These are the same pts (now being labeled as having "irritable larynx syndrome" by some cough centers) who not infrequently have a history of having failed to tolerate ace inhibitors,  dry powder inhalers or biphosphonates or report having atypical/extraesophageal reflux symptoms(LPR)  that don't respond to standard doses of PPI  and are easily confused as having aecopd or asthma flares by even experienced allergists/ pulmonologists (myself included).   Of the three most common causes of  Sub-acute / recurrent or chronic cough, only one (GERD)  can actually contribute to/ trigger  the other two (asthma and post nasal drip syndrome)  and perpetuate the cylce of cough.  While not intuitively obvious, many patients with chronic low grade reflux do not cough until there is a primary insult that disturbs the protective epithelial barrier and exposes sensitive nerve endings.   This is typically viral but can due to PNDS and  either may apply here.   The point is that once this occurs, it is difficult to eliminate the cycle  using anything but a maximally effective acid suppression regimen at least in the short run, accompanied by an appropriate diet to address non acid GERD and control / eliminate the cough/ throat clearing with gabapentin titrated as above >>> also added  depomedrol 120 mg IM in case  of component of Th-2 driven upper or lower airways inflammation (if cough responds short term only to relapse before return while will on full rx for uacs (as above), then that would point to allergic rhinitis/ asthma or eos bronchitis as alternative dx)      Rec: As per above F/u in 6 weeks with all meds in hand using a trust but verify approach to confirm accurate Medication  Reconciliation The principal here is that until we are certain that the  patients are doing what we've asked, it makes no sense to ask them to do more.   Each maintenance medication was reviewed in detail including emphasizing most importantly the difference between maintenance and prns and under what circumstances the prns are to be triggered using an action plan format where appropriate.  Total time for H and P, chart review, counseling,  directly observing portions of ambulatory 02 saturation study/ and generating customized AVS unique to this office visit / same day charting = 61 minutes

## 2023-09-20 ENCOUNTER — Encounter (INDEPENDENT_AMBULATORY_CARE_PROVIDER_SITE_OTHER): Admitting: Cardiology

## 2023-09-20 ENCOUNTER — Telehealth: Payer: Self-pay | Admitting: Internal Medicine

## 2023-09-20 DIAGNOSIS — G4733 Obstructive sleep apnea (adult) (pediatric): Secondary | ICD-10-CM | POA: Diagnosis not present

## 2023-09-20 NOTE — Telephone Encounter (Signed)
 Try off the protonix first at it is much more likely the problem and just take the pepcid 20 mg bid after bfast and supper.   If the diarrhea continues then stop the gabapentin but I've never had a pt complain of this side effect

## 2023-09-20 NOTE — Telephone Encounter (Signed)
 PT's wife calling (DPR)  saw Dr. Sherene Sires last week. He was Rx'd 3 meds and has "liquid diarrhea". Please  call to advise @ [phone]

## 2023-09-20 NOTE — Telephone Encounter (Signed)
 Called and spoke with patient wife's she said that he started to have diarrhea after he started the gabapentin , she stated that he started this on Saturday and Sunday is when this happen. Pt isn't having any or symptoms , she mention that he is a little sleep however you had mention that to them when starting this medication.  Pt is taking 100mg  at bedtime

## 2023-09-20 NOTE — Telephone Encounter (Signed)
 Called and spoke with patient's wife  regarding this message said that that he had already had taken the Protonix for the morning she said that she will try this to see if help on tomorrow. She will call us back if it doesn't improve

## 2023-09-21 ENCOUNTER — Ambulatory Visit: Attending: Cardiovascular Disease

## 2023-09-21 DIAGNOSIS — R0683 Snoring: Secondary | ICD-10-CM

## 2023-09-21 DIAGNOSIS — R400 Somnolence: Secondary | ICD-10-CM

## 2023-09-21 NOTE — Procedures (Signed)
   SLEEP STUDY REPORT Patient Information Study Date: 09/20/2023 Patient Name: Corey Suarez Patient ID: [id number] Birth Date: [birth year] Age: 73 Gender: Male BMI: 23.8 (W=170 lb, H=5' 11'') Referring Physician: Chilton Si, MD  TEST DESCRIPTION: Home sleep apnea testing was completed using the WatchPat, a Type 1 device, utilizing  peripheral arterial tonometry (PAT), chest movement, actigraphy, pulse oximetry, pulse rate, body position and snore.  AHI was calculated with apnea and hypopnea using valid sleep time as the denominator. RDI includes apneas,  hypopneas, and RERAs. The data acquired and the scoring of sleep and all associated events were performed in  accordance with the recommended standards and specifications as outlined in the AASM Manual for the Scoring of  Sleep and Associated Events 2.2.0 (2015).  FINDINGS:  1. Severe Obstructive Sleep Apnea with AHI 86.6/hr.   2. Moderate Central Sleep Apnea with pAHIc 25.3/hr.  3. Oxygen desaturations as low as 73%.  4. Severe snoring was present. O2 sats were < 88% for 639.2 min.  5. Total sleep time was 9 hrs and 4 min.  6. 3.2% of total sleep time was spent in REM sleep.   7. Normal sleep onset latency at 18 min.   8. Shortened REM sleep onset latency at 50 min.   9. Total awakenings were 20.  10. Arrhythmia detection: None  DIAGNOSIS:  Severe Obstructive Sleep Apnea (G47.33) Moderate Obstructive Sleep Apnea Nocturnal Hypoxemia  RECOMMENDATIONS: 1. Clinical correlation of these findings is necessary. The decision to treat obstructive sleep apnea (OSA) is usually  based on the presence of apnea symptoms or the presence of associated medical conditions such as Hypertension,  Congestive Heart Failure, Atrial Fibrillation or Obesity. The most common symptoms of OSA are snoring, gasping for  breath while sleeping, daytime sleepiness and fatigue.   2. Initiating apnea therapy is recommended given the presence of  symptoms and/or associated conditions.  Recommend proceeding with one of the following:   a. Auto-CPAP therapy with a pressure range of 5-20cm H2O.   b. An oral appliance (OA) that can be obtained from certain dentists with expertise in sleep medicine. These are  primarily of use in non-obese patients with mild and moderate disease.   c. An ENT consultation which may be useful to look for specific causes of obstruction and possible treatment  options.   d. If patient is intolerant to PAP therapy, consider referral to ENT for evaluation for hypoglossal nerve stimulator.   3. Close follow-up is necessary to ensure success with CPAP or oral appliance therapy for maximum benefit .  4. A follow-up oximetry study on CPAP is recommended to assess the adequacy of therapy and determine the need  for supplemental oxygen or the potential need for Bi-level therapy. An arterial blood gas to determine the adequacy of  baseline ventilation and oxygenation should also be considered.  5. Healthy sleep recommendations include: adequate nightly sleep (normal 7-9 hrs/night), avoidance of caffeine after  noon and alcohol near bedtime, and maintaining a sleep environment that is cool, dark and quiet.  6. Weight loss for overweight patients is recommended. Even modest amounts of weight loss can significantly  improve the severity of sleep apnea.  7. Snoring recommendations include: weight loss where appropriate, side sleeping, and avoidance of alcohol before  bed.  8. Operation of motor vehicle should be avoided when sleepy.  Signature: Armanda Magic, MD; [facility]; Diplomat, American Board of Sleep  Medicine Electronically Signed: 09/21/2023 2:32:42 PM

## 2023-09-22 ENCOUNTER — Telehealth: Payer: Self-pay | Admitting: *Deleted

## 2023-09-22 ENCOUNTER — Encounter: Payer: Self-pay | Admitting: Internal Medicine

## 2023-09-22 DIAGNOSIS — G4733 Obstructive sleep apnea (adult) (pediatric): Secondary | ICD-10-CM

## 2023-09-22 NOTE — Telephone Encounter (Signed)
 Called and spoke with patient advised patient 's wife since he has take his nightly dose he  doesn't need to take one  tonight, she said that his been sleeping a lot more she knows that this coming from the medication . Also pt has had sleep study done  sent a My chart regarding this to Dr Sherene Sires.

## 2023-09-22 NOTE — Telephone Encounter (Signed)
 Patient's wife called and stated that patient took Gabapentin this morning instead of last night and wanted to see if that was okay or if an additional medication needed to be given.   Please call and advise patient [phone]

## 2023-09-23 NOTE — Telephone Encounter (Signed)
 Will ask Corey Manis NP to get the cpap started now and f/u with him when she's in [place]

## 2023-09-24 NOTE — Telephone Encounter (Signed)
 Sleep study on 09/20/23 showed severe OSA, AHI 86. Can we please place an order for auto CPAP 5-20cm h20 with mask of choice.

## 2023-09-24 NOTE — Progress Notes (Signed)
 Patient is being followed by [facility] for Sleep Compliance

## 2023-10-05 ENCOUNTER — Telehealth: Payer: Self-pay | Admitting: *Deleted

## 2023-10-05 NOTE — Telephone Encounter (Signed)
 Per DPR reached out and spoke to his wife who states the patient sees [facility]). Patients spouse states  pulmonary has already ordered the auto machine for the patient.

## 2023-10-06 ENCOUNTER — Ambulatory Visit: Payer: Self-pay

## 2023-10-06 ENCOUNTER — Encounter: Payer: Self-pay | Admitting: Internal Medicine

## 2023-10-06 ENCOUNTER — Institutional Professional Consult (permissible substitution): Admitting: Internal Medicine

## 2023-10-07 ENCOUNTER — Ambulatory Visit: Admitting: Internal Medicine

## 2023-10-07 NOTE — Progress Notes (Deleted)
 ABASS MISENER, male    DOB: June 26, 1951    MRN: [id number]   Brief patient profile:  51   yowm never smoker from [facility]  referred to pulmonary clinic in [place]  09/17/2023 by Maxine Glenn PA  for unexplained sob p ET for ablation.     [employer] Nutritional therapist / served in  Engineer, technical sales room    Prior to ablation at [facility] 06/03/22  could weed wack all day  Wife say says took meds x 40 days to help throat did nothing - not sure what it was  [facility] 04/20/23 for LPR with nl scope Barium esophogram 02/04/23 demonstrated normal esophageal motility with mild reflux   [facility]   09/01/2023 nl pressures  / low CO    History of Present Illness  09/17/2023  Pulmonary/ 1st office eval/ [facility] / [place] [facility]  Chief Complaint  Patient presents with   Consult    Having sob   Dyspnea:  100 yards    mb is 75 ft slt uphill to mb  Cough: still feels globus and urge to clear throat  Sleep: 30 degrees in recliner / bed 45 degrees otherwises can't breathe "points to suprasternal notch where feels "blocked" "choked" esp if try to lie flatter or extend neck  SABA use: never needed  02: none  LDSCT:none  "Nothing ever worked" but maybe gabapentin  (may have misunderstood instructions cause "stopped it when got better"  - no reported side effects  Rec     10/07/2023  f/u ov/[place] office/Rachella Basden re: *** maint on ***  No chief complaint on file.   Dyspnea:  *** Cough: *** Sleeping: ***   resp cc  SABA use: *** 02: ***  Lung cancer screening: ***   No obvious day to day or daytime variability or assoc excess/ purulent sputum or mucus plugs or hemoptysis or cp or chest tightness, subjective wheeze or overt sinus or hb symptoms.    Also denies any obvious fluctuation of symptoms with weather or environmental changes or other aggravating or alleviating factors except as outlined above   No unusual exposure hx or h/o childhood pna/ asthma or knowledge of premature birth.  Current Allergies,  Complete Past Medical History, Past Surgical History, Family History, and Social History were reviewed in [employer] record.  ROS  The following are not active complaints unless bolded Hoarseness, sore throat, dysphagia, dental problems, itching, sneezing,  nasal congestion or discharge of excess mucus or purulent secretions, ear ache,   fever, chills, sweats, unintended wt loss or wt gain, classically pleuritic or exertional cp,  orthopnea pnd or arm/hand swelling  or leg swelling, presyncope, palpitations, abdominal pain, anorexia, nausea, vomiting, diarrhea  or change in bowel habits or change in bladder habits, change in stools or change in urine, dysuria, hematuria,  rash, arthralgias, visual complaints, headache, numbness, weakness or ataxia or problems with walking or coordination,  change in mood or  memory.        No outpatient medications have been marked as taking for the 10/07/23 encounter (Appointment) with Nyoka Cowden, MD.           Past Medical History:  Diagnosis Date   Arrhythmia       Objective:     Wt Readings from Last 3 Encounters:  09/17/23 167 lb (75.8 kg)  09/01/23 162 lb (73.5 kg)  08/13/23 169 lb 12.8 oz (77 kg)      Vital signs reviewed  10/07/2023  -  Note at rest 02 sats  ***% on ***   General appearance:    ***        I personally reviewed images and agree with radiology impression as follows:   -Chest CT w/o contrast   05/17/23  Bibasilar subsegmental volume loss. Lungs are otherwise clear. Scattered pleural calcifications in the left hemithorax. No pleural fluid. Airway is unremarkable.     - Vq 08/31/23 low prob      Assessment

## 2023-10-11 NOTE — Telephone Encounter (Signed)
 Patient is deceased. Nothing further needed.

## 2023-10-12 NOTE — Telephone Encounter (Signed)
 Copied from CRM [id number]. Topic: Clinical - Red Word Triage >> Oct 06, 2023 11:07 AM Adele Barthel wrote: Red Word that prompted transfer to Nurse Triage:   Recently started gabapentin 100 mg, 3 weeks ago Started off with 100 mg 1 x day and is now taking 3 x day Having excessive sleepiness that is getting progressively worse.  Is not able to stay awake for activities.   Chief Complaint: Drowsiness, Fatigue  Symptoms: Sleepiness, Fatigue  Frequency: Ongoing, 3 Weeks  Pertinent Negatives: Patient denies chest pain, dyspnea  Disposition: [] ED /[] Urgent Care (no appt availability in office) / [x] Appointment(In office/virtual)/ []  [facility] [facility]/ [] Home Care/ [] Refused Recommended Disposition /[] [place] [facility]/ []  Follow-up with PCP Additional Notes: GB is being triaged for excessive sleepiness. Previously saw Dr. Sherene Sires 3 weeks ago and initiated on that date. Spoke with wife for triage. The patient has been for multiple referrals and diagnostic tests and they have all been unremarkable according to the patient's wife. Due to the concern, and symptom presentation, scheduled an appointment in office with Dr. Sherene Sires tomorrow morning.   E2C2 Pulmonary Triage - Initial Assessment Questions "Chief Complaint (e.g., cough, sob, wheezing, fever, chills, sweat or additional symptoms) *Go to specific symptom protocol after initial questions. Excessive Sleepiness  "How long have symptoms been present?" 3 Weeks  Have you tested for COVID or Flu? Note: If not, ask patient if a home test can be taken. If so, instruct patient to call back for positive results. No  MEDICINES:   "Have you used any OTC meds to help with symptoms?" No If yes, ask "What medications?" N/A     Reason for Disposition  Taking a medicine that could cause weakness (e.g., blood pressure medications, diuretics)  Answer Assessment - Initial Assessment Questions 1. DESCRIPTION: "Describe how you are feeling."      Excessive Sleepiness  2. SEVERITY: "How bad is it?"  "Can you stand and walk?"   - MILD (0-3): Feels weak or tired, but does not interfere with work, school or normal activities.   - MODERATE (4-7): Able to stand and walk; weakness interferes with work, school, or normal activities.   - SEVERE (8-10): Unable to stand or walk; unable to do usual activities.     Severe  3. ONSET: "When did these symptoms begin?" (e.g., hours, days, weeks, months)     3 Weeks  4. CAUSE: "What do you think is causing the weakness or fatigue?" (e.g., not drinking enough fluids, medical problem, trouble sleeping)     Unsure, only new change is Gabapentin.   5. NEW MEDICINES:  "Have you started on any new medicines recently?" (e.g., opioid pain medicines, benzodiazepines, muscle relaxants, antidepressants, antihistamines, neuroleptics, beta blockers)     Gabapentin  6. OTHER SYMPTOMS: "Do you have any other symptoms?" (e.g., chest pain, fever, cough, SOB, vomiting, diarrhea, bleeding, other areas of pain)     No  Protocols used: Weakness (Generalized) and Fatigue-A-AH

## 2023-10-12 DEATH — deceased

## 2023-10-13 NOTE — Telephone Encounter (Signed)
 I had already responded by MyChart to the issue of stopping the gabapentin (which he reported previously tolerating for pain disorder) or lowering the dose to whatever lower dose  helped with the cough but was not causing drowsiness which was the originally intended target dose.

## 2023-10-18 ENCOUNTER — Other Ambulatory Visit (HOSPITAL_COMMUNITY)

## 2023-10-28 NOTE — Telephone Encounter (Signed)
 NFN

## 2023-11-01 ENCOUNTER — Ambulatory Visit: Admitting: Internal Medicine

## 2024-01-07 ENCOUNTER — Ambulatory Visit: Payer: Medicare Other | Admitting: Cardiology
